# Patient Record
Sex: Male | Born: 1985 | Race: White | Hispanic: No | Marital: Single | Smoking: Current every day smoker
Health system: Southern US, Community
[De-identification: ages and names within clinical notes are randomized; demographics above are authoritative.]

## PROBLEM LIST (undated history)

## (undated) DIAGNOSIS — F32A Depression, unspecified: Secondary | ICD-10-CM

## (undated) DIAGNOSIS — G8929 Other chronic pain: Secondary | ICD-10-CM

## (undated) DIAGNOSIS — F17200 Nicotine dependence, unspecified, uncomplicated: Secondary | ICD-10-CM

## (undated) DIAGNOSIS — Z87898 Personal history of other specified conditions: Secondary | ICD-10-CM

## (undated) DIAGNOSIS — I10 Essential (primary) hypertension: Secondary | ICD-10-CM

## (undated) DIAGNOSIS — F410 Panic disorder [episodic paroxysmal anxiety] without agoraphobia: Secondary | ICD-10-CM

## (undated) HISTORY — DX: Other chronic pain: G89.29

## (undated) HISTORY — DX: Depression, unspecified: F32.A

## (undated) HISTORY — DX: Nicotine dependence, unspecified, uncomplicated: F17.200

## (undated) HISTORY — DX: Personal history of other specified conditions: Z87.898

## (undated) HISTORY — DX: Panic disorder (episodic paroxysmal anxiety): F41.0

## (undated) HISTORY — DX: Essential (primary) hypertension: I10

## (undated) HISTORY — PX: OTHER SURGICAL HISTORY: SHX169

---

## 2007-03-24 ENCOUNTER — Emergency Department (HOSPITAL_COMMUNITY): Admission: EM | Admit: 2007-03-24 | Discharge: 2007-03-24 | Payer: Self-pay | Admitting: Emergency Medicine

## 2007-03-27 ENCOUNTER — Emergency Department (HOSPITAL_COMMUNITY): Admission: EM | Admit: 2007-03-27 | Discharge: 2007-03-27 | Payer: Self-pay | Admitting: Emergency Medicine

## 2007-03-31 ENCOUNTER — Emergency Department (HOSPITAL_COMMUNITY): Admission: EM | Admit: 2007-03-31 | Discharge: 2007-03-31 | Payer: Self-pay | Admitting: Emergency Medicine

## 2007-04-03 ENCOUNTER — Emergency Department (HOSPITAL_COMMUNITY): Admission: EM | Admit: 2007-04-03 | Discharge: 2007-04-03 | Payer: Self-pay | Admitting: Emergency Medicine

## 2007-04-07 ENCOUNTER — Emergency Department (HOSPITAL_COMMUNITY): Admission: EM | Admit: 2007-04-07 | Discharge: 2007-04-07 | Payer: Self-pay | Admitting: Emergency Medicine

## 2007-04-21 ENCOUNTER — Emergency Department (HOSPITAL_COMMUNITY): Admission: EM | Admit: 2007-04-21 | Discharge: 2007-04-21 | Payer: Self-pay | Admitting: Emergency Medicine

## 2008-06-30 IMAGING — CR DG TIBIA/FIBULA 2V*L*
4 series · 4 of 4 positions shown · non-contrast
Comparison: none

CLINICAL DATA: 20-year-old with dog bite to calf. Puncture wounds.
 LEFT TIBIA AND FIBULA - 2 VIEW:

[view not recorded (1 of 4)]
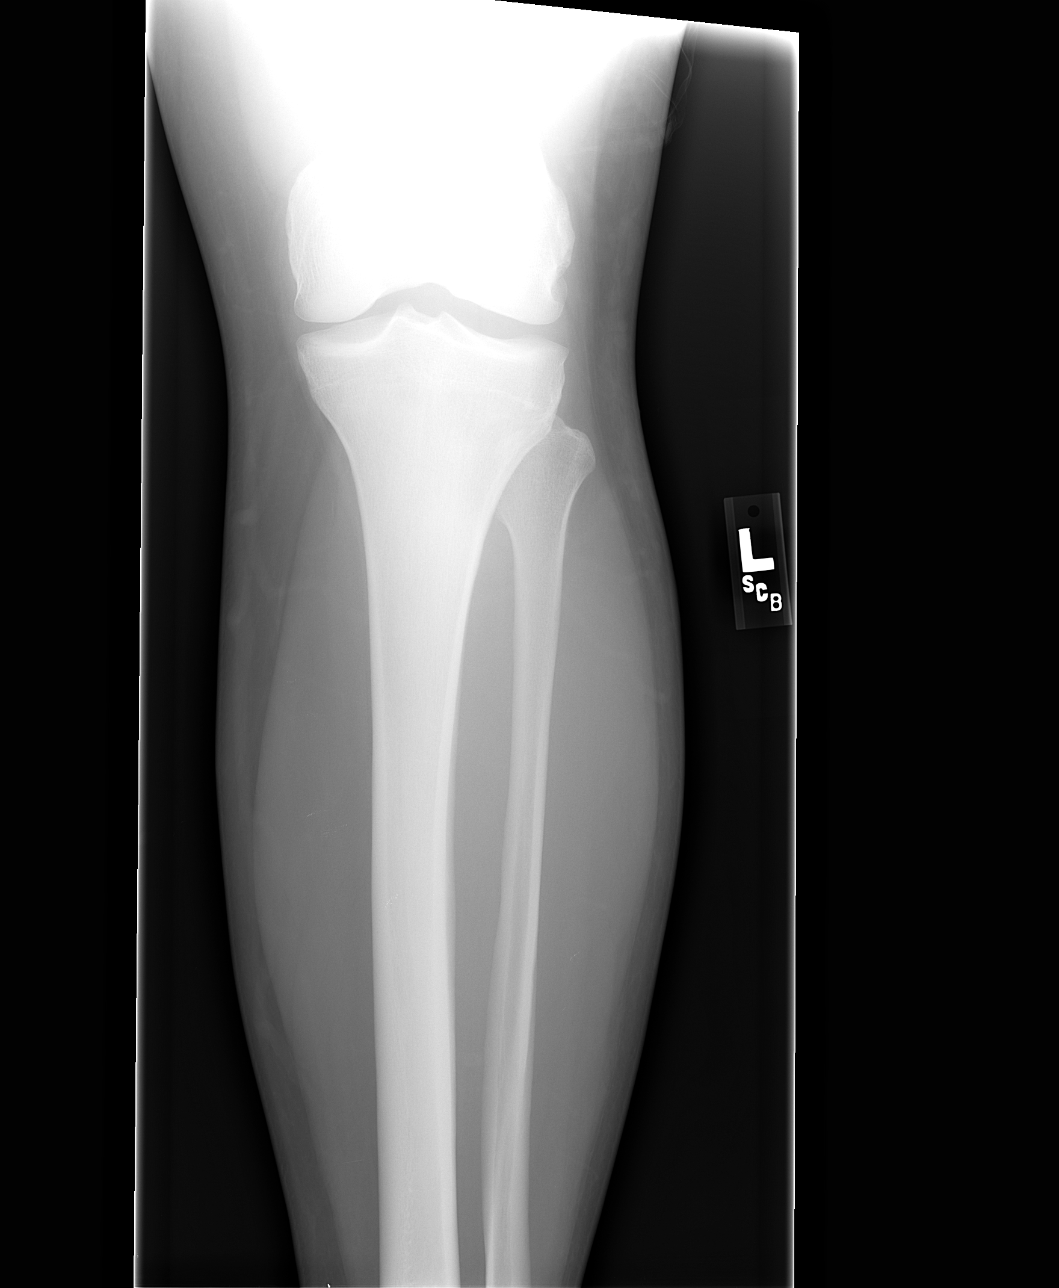

[view not recorded (2 of 4)]
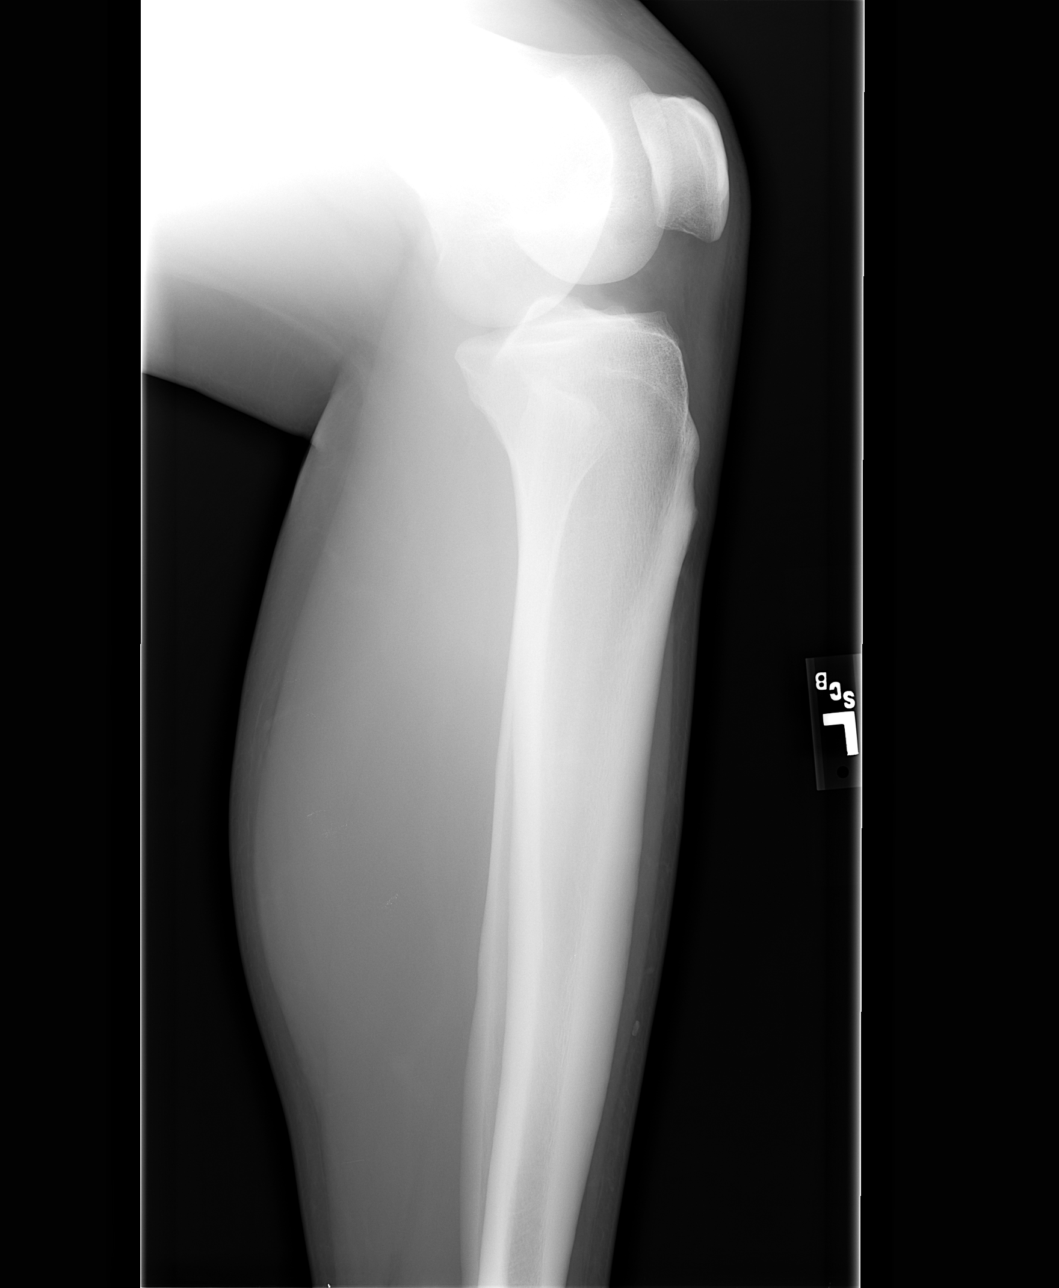

[view not recorded (3 of 4)]
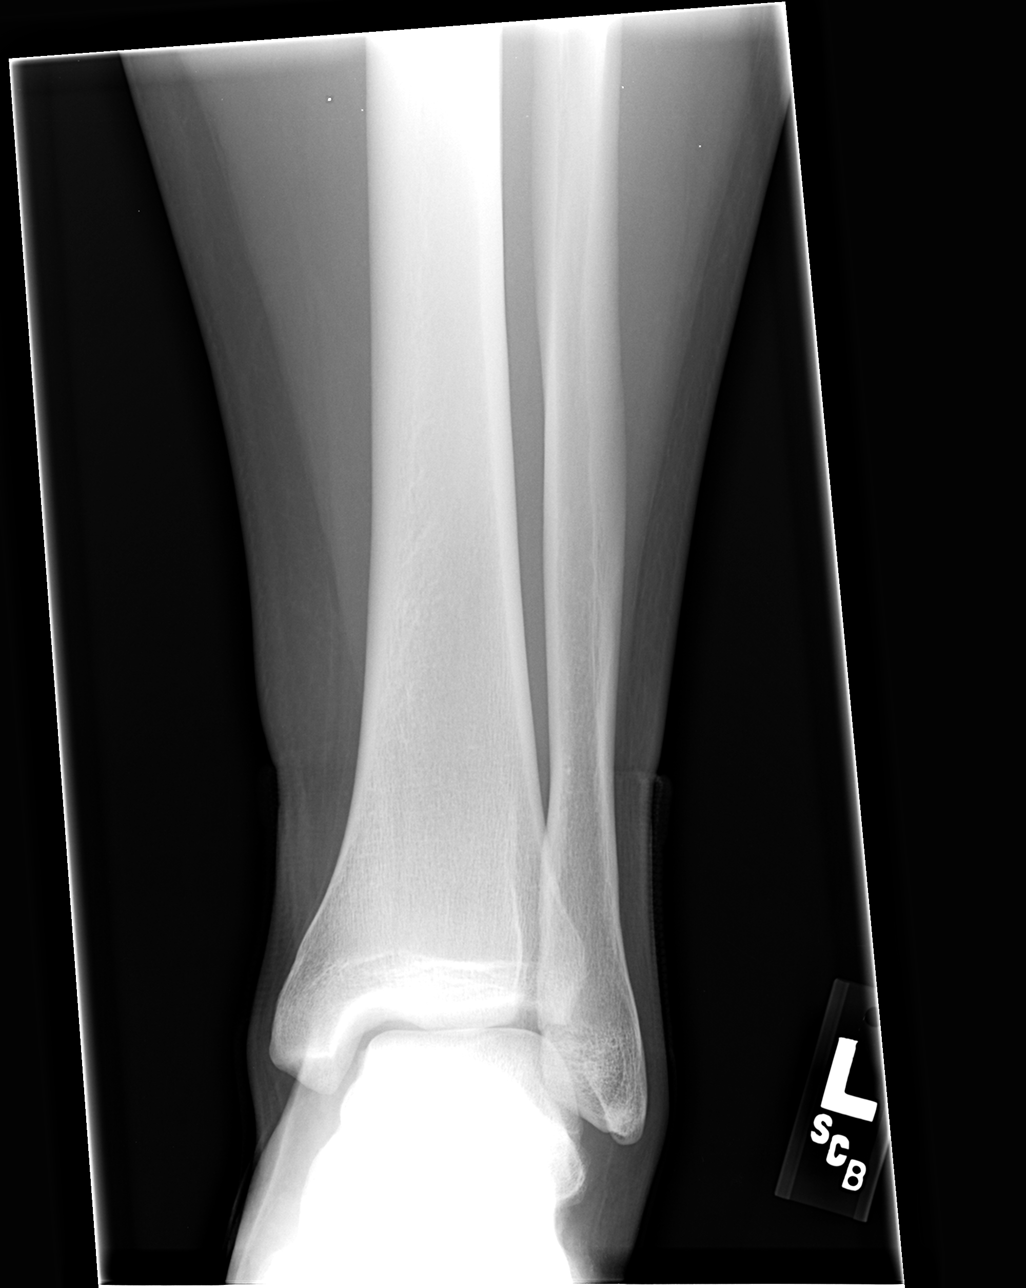

[view not recorded (4 of 4)]
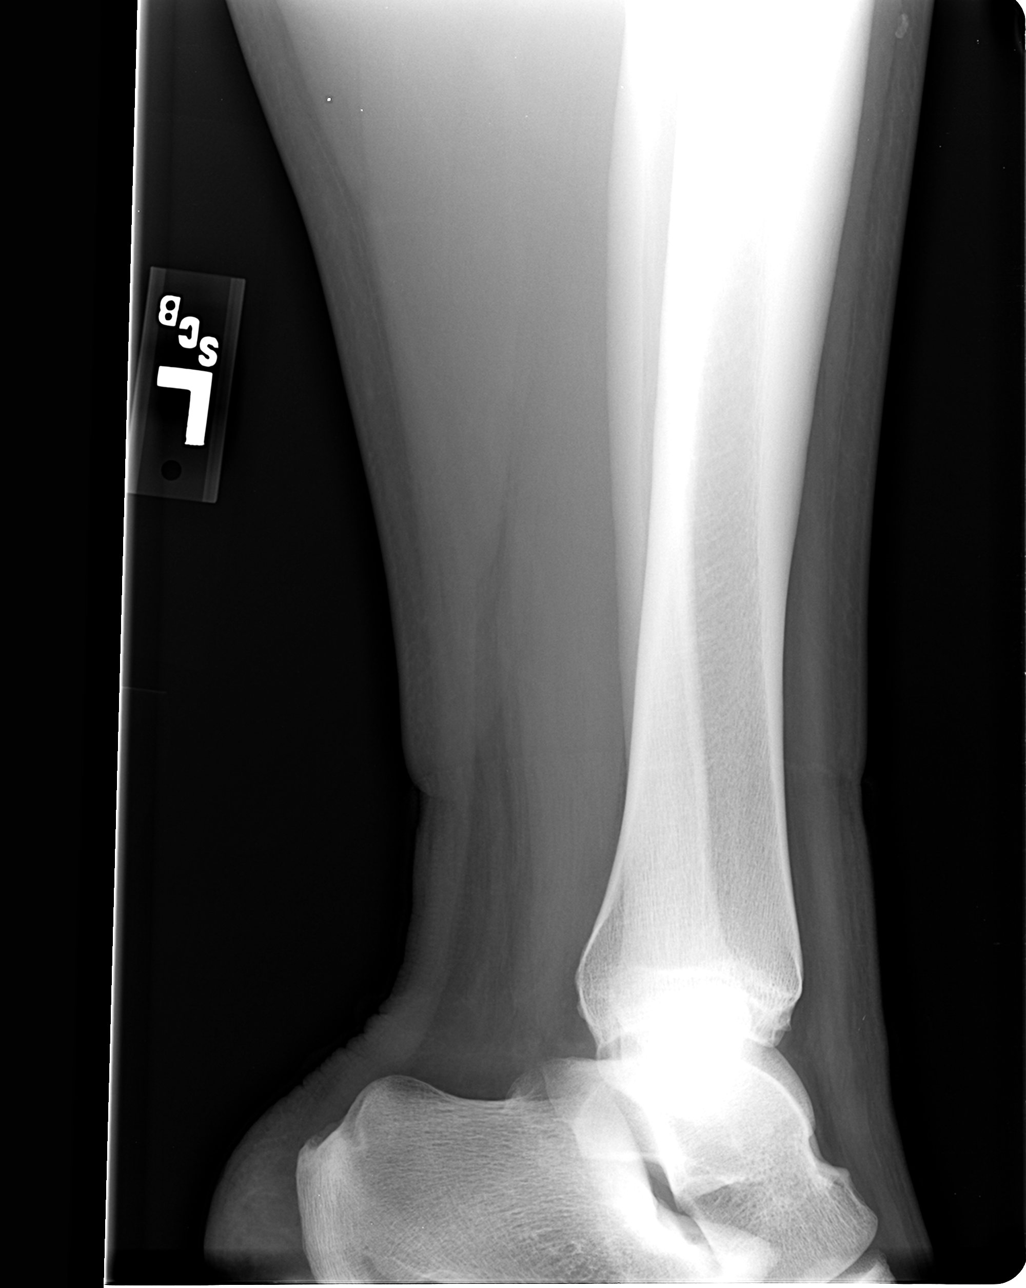

[4 of 4 positions shown; findings below may reference images not displayed]

FINDINGS: There is no evidence of fracture or other focal bone lesions.  Soft tissues are unremarkable.
IMPRESSION: Negative.

## 2009-02-15 ENCOUNTER — Emergency Department (HOSPITAL_COMMUNITY): Admission: EM | Admit: 2009-02-15 | Discharge: 2009-02-15 | Payer: Self-pay | Admitting: Emergency Medicine

## 2009-08-31 ENCOUNTER — Ambulatory Visit: Payer: Self-pay | Admitting: Internal Medicine

## 2009-08-31 ENCOUNTER — Inpatient Hospital Stay (HOSPITAL_COMMUNITY): Admission: EM | Admit: 2009-08-31 | Discharge: 2009-09-01 | Payer: Self-pay | Admitting: Emergency Medicine

## 2009-09-02 ENCOUNTER — Encounter (INDEPENDENT_AMBULATORY_CARE_PROVIDER_SITE_OTHER): Payer: Self-pay | Admitting: *Deleted

## 2010-09-13 NOTE — Letter (Signed)
Summary: Plan of Care, Need to Discuss  [facility]  [address]   [place], [place] [address]   Phone: [phone]  Fax: [phone]    September 02, 2009  MOLLY MASELLI [address] [facility] [address], [place]  [address] 1985/10/20   Dear Mr. Gloor,   We are writing this letter to inform you of treatment plans and/or discuss your plan of care.  We have tried several times to contact you; however, we have yet to reach you.  We ask that you please contact our office for follow-up on your gastrointestinal issues.  We can  be reached at [phone] to schedule an appointment, or to speak with someone regarding your health care needs.  Please do not neglect your health.   Sincerely,    Ave Filter  [facility] Ph: [phone]    Fax: [phone]

## 2010-10-30 LAB — DIFFERENTIAL
Basophils Relative: 0 % (ref 0–1)
Eosinophils Absolute: 0 10*3/uL (ref 0.0–0.7)
Eosinophils Absolute: 0 10*3/uL (ref 0.0–0.7)
Eosinophils Relative: 0 % (ref 0–5)
Lymphocytes Relative: 4 % — ABNORMAL LOW (ref 12–46)
Lymphs Abs: 0.4 10*3/uL — ABNORMAL LOW (ref 0.7–4.0)
Lymphs Abs: 0.6 10*3/uL — ABNORMAL LOW (ref 0.7–4.0)
Monocytes Absolute: 0.2 10*3/uL (ref 0.1–1.0)
Monocytes Absolute: 0.5 10*3/uL (ref 0.1–1.0)
Monocytes Relative: 3 % (ref 3–12)
Monocytes Relative: 4 % (ref 3–12)
Neutro Abs: 10.2 10*3/uL — ABNORMAL HIGH (ref 1.7–7.7)
Neutrophils Relative %: 91 % — ABNORMAL HIGH (ref 43–77)

## 2010-10-30 LAB — PROTIME-INR: INR: 1.07 (ref 0.00–1.49)

## 2010-10-30 LAB — CBC
HCT: 42.5 % (ref 39.0–52.0)
Hemoglobin: 14.8 g/dL (ref 13.0–17.0)
Hemoglobin: 16.4 g/dL (ref 13.0–17.0)
MCHC: 34.5 g/dL (ref 30.0–36.0)
MCV: 86.5 fL (ref 78.0–100.0)
MCV: 86.5 fL (ref 78.0–100.0)
RBC: 5.49 MIL/uL (ref 4.22–5.81)
WBC: 11.2 10*3/uL — ABNORMAL HIGH (ref 4.0–10.5)
WBC: 8.5 10*3/uL (ref 4.0–10.5)

## 2010-10-30 LAB — APTT: aPTT: 26 seconds (ref 24–37)

## 2010-10-30 LAB — HEMOGLOBIN AND HEMATOCRIT, BLOOD
HCT: 38 % — ABNORMAL LOW (ref 39.0–52.0)
HCT: 39.8 % (ref 39.0–52.0)
Hemoglobin: 13.4 g/dL (ref 13.0–17.0)
Hemoglobin: 14.5 g/dL (ref 13.0–17.0)

## 2010-10-30 LAB — BASIC METABOLIC PANEL
BUN: 13 mg/dL (ref 6–23)
CO2: 25 mEq/L (ref 19–32)
Chloride: 105 mEq/L (ref 96–112)
Chloride: 109 mEq/L (ref 96–112)
Creatinine, Ser: 0.91 mg/dL (ref 0.4–1.5)
GFR calc Af Amer: >60 mL/min (ref >60)
GFR calc non Af Amer: >60 mL/min (ref >60)
Potassium: 3.8 mEq/L (ref 3.5–5.1)
Sodium: 138 mEq/L (ref 135–145)
Sodium: 140 mEq/L (ref 135–145)

## 2011-05-29 LAB — DIFFERENTIAL
Basophils Absolute: 0
Eosinophils Absolute: 0
Eosinophils Relative: 0
Lymphocytes Relative: 16
Lymphs Abs: 1.4
Monocytes Absolute: 0.9 — ABNORMAL HIGH

## 2011-05-29 LAB — COMPREHENSIVE METABOLIC PANEL
ALT: 23
AST: 19
Albumin: 3.8
Chloride: 103
Creatinine, Ser: 0.92
GFR calc Af Amer: >60
Sodium: 139
Total Bilirubin: 1.1

## 2011-05-29 LAB — CBC
MCV: 86.9
Platelets: 173
RBC: 5.02
WBC: 8.7

## 2011-05-29 LAB — RAPID STREP SCREEN (MED CTR MEBANE ONLY): Streptococcus, Group A Screen (Direct): NEGATIVE

## 2017-05-14 DIAGNOSIS — F419 Anxiety disorder, unspecified: Secondary | ICD-10-CM | POA: Insufficient documentation

## 2017-05-14 DIAGNOSIS — F410 Panic disorder [episodic paroxysmal anxiety] without agoraphobia: Secondary | ICD-10-CM | POA: Insufficient documentation

## 2022-05-09 ENCOUNTER — Ambulatory Visit (INDEPENDENT_AMBULATORY_CARE_PROVIDER_SITE_OTHER): Payer: 59

## 2022-05-09 ENCOUNTER — Ambulatory Visit: Payer: Self-pay

## 2022-05-09 ENCOUNTER — Ambulatory Visit
Admission: EM | Admit: 2022-05-09 | Discharge: 2022-05-09 | Disposition: A | Discharge status: Home / Self Care | Payer: 59 | Attending: Nurse Practitioner | Admitting: Nurse Practitioner

## 2022-05-09 ENCOUNTER — Encounter: Payer: Self-pay | Admitting: Emergency Medicine

## 2022-05-09 DIAGNOSIS — M7711 Lateral epicondylitis, right elbow: Secondary | ICD-10-CM

## 2022-05-09 DIAGNOSIS — M25521 Pain in right elbow: Secondary | ICD-10-CM | POA: Diagnosis not present

## 2022-05-09 MED ORDER — PREDNISONE 20 MG PO TABS: 40.0000 mg | ORAL_TABLET | Freq: Every day | ORAL | 0 refills | Status: AC

## 2022-05-09 NOTE — ED Triage Notes (Signed)
Right elbow pain x 4 days. Hurts to straighten arm.  States arm feels hot. No known injury.

## 2022-05-09 NOTE — ED Provider Notes (Signed)
[facility]    CSN: [id number] Arrival date & time: 05/09/22  1319      History   Chief Complaint No chief complaint on file.   HPI Jeffrey Hoover is a 36 y.o. male.   The history is provided by the patient.   Presents for complaints of right elbow pain that started approximately 4 days ago.  Patient states over the past 24 hours, pain has progressively worsened.  He complains of pain to the lateral aspect of the elbow that radiates into the right shoulder and right mid forearm.  He states the arm feels "hot on the inside".  He also complains of heaviness of the right upper extremity.  States that he has decreased grip strength and is unable to fully extend the right arm/elbow.  Patient also complains of numbness and tingling.  He denies any new injury or trauma, swelling, or bruising.  He has not taken any medication for his symptoms.  History reviewed. No pertinent past medical history.  There are no problems to display for this patient.   History reviewed. No pertinent surgical history.     Home Medications    Prior to Admission medications   Medication Sig Start Date End Date Taking? Authorizing Provider  predniSONE (DELTASONE) 20 MG tablet Take 2 tablets (40 mg total) by mouth daily with breakfast for 5 days. 05/09/22 05/14/22 Yes Curly Mackowski-Warren, Alda Lea, NP    Family History History reviewed. No pertinent family history.  Social History Social History   Tobacco Use   Smoking status: Former    Types: Cigarettes   Smokeless tobacco: Never  Vaping Use   Vaping Use: Every day   Substances: Nicotine-salt  Substance Use Topics   Alcohol use: Never   Drug use: Never     Allergies   Sulfa antibiotics   Review of Systems Review of Systems Per HPI  Physical Exam Triage Vital Signs ED Triage Vitals  Enc Vitals Group     BP 05/09/22 1334 (!) 130/94     Pulse Rate 05/09/22 1334 84     Resp 05/09/22 1334 16     Temp 05/09/22 1334 98.3 F  (36.8 C)     Temp Source 05/09/22 1334 Oral     SpO2 05/09/22 1334 98 %     Weight --      Height --      Head Circumference --      Peak Flow --      Pain Score 05/09/22 1335 6     Pain Loc --      Pain Edu? --      Excl. in [place]? --    No data found.  Updated Vital Signs BP (!) 130/94 (BP Location: Right Arm)   Pulse 84   Temp 98.3 F (36.8 C) (Oral)   Resp 16   SpO2 98%   Visual Acuity Right Eye Distance:   Left Eye Distance:   Bilateral Distance:    Right Eye Near:   Left Eye Near:    Bilateral Near:     Physical Exam Vitals and nursing note reviewed.  Constitutional:      Appearance: Normal appearance.  HENT:     Head: Normocephalic.  Eyes:     Extraocular Movements: Extraocular movements intact.     Pupils: Pupils are equal, round, and reactive to light.  Cardiovascular:     Pulses: Normal pulses.  Pulmonary:     Effort: Pulmonary effort is normal.  Musculoskeletal:  Right elbow: Decreased range of motion. Tenderness present in lateral epicondyle.     Cervical back: Normal range of motion.  Skin:    General: Skin is warm and dry.  Neurological:     Mental Status: He is alert.  Psychiatric:        Mood and Affect: Mood normal.        Behavior: Behavior normal.      UC Treatments / Results  Labs (all labs ordered are listed, but only abnormal results are displayed) Labs Reviewed - No data to display  EKG   Radiology DG Elbow Complete Right  Result Date: 05/09/2022 CLINICAL DATA:  Elbow pain 4 days no injury EXAM: RIGHT ELBOW - COMPLETE 3+ VIEW COMPARISON:  None Available. FINDINGS: Negative for acute fracture.  No joint effusion Well corticated ossicles adjacent to the olecranon compatible with chronic injury. Elbow joint spaces normal. IMPRESSION: Negative for acute fracture. Well corticated ossicle adjacent to the olecranon likely due to chronic injury. Electronically Signed   By: Marlan Palau M.D.   On: 05/09/2022 13:55     Procedures Procedures (including critical care time)  Medications Ordered in UC Medications - No data to display  Initial Impression / Assessment and Plan / UC Course  I have reviewed the triage vital signs and the nursing notes.  Pertinent labs & imaging results that were available during my care of the patient were reviewed by me and considered in my medical decision making (see chart for details).  Patient presents for complaints of right elbow pain that has been present for the past 4 days.  There is no known injury or trauma noted.  X-rays are negative for fracture or dislocation but do show a "well-corticated ossicle adjacent to the olecranon".  Based on the patient's location of his tenderness, symptoms are consistent with lateral epicondylitis.  It is likely that the corticated ossicle may also be contributing to his symptoms.  Will start patient on prednisone to see if this helps with inflammation.  Also recommend RICE therapy.  Patient was advised to perform gentle range of motion exercises to prevent joint immobility.  Patient advised to follow-up with orthopedics if symptoms fail to improve.  Patient was given information for [facility] and for [facility]. Final Clinical Impressions(s) / UC Diagnoses   Final diagnoses:  Lateral epicondylitis of right elbow  Right elbow pain     Discharge Instructions      The x-rays negative for fracture or dislocation.  The x-ray does show what appears to be a bone spur in the area in which you are having pain.  This likely is contributing to the symptoms you are currently experiencing. Take medication as prescribed.  May take Tylenol Arthritis Strength tablets for breakthrough pain while taking the prednisone.  Once you complete the prednisone, you can take over-the-counter ibuprofen for pain and inflammation.  Take medication as directed and with food. RICE therapy, rest, ice, compression, and elevation. Gentle stretching and  range of motion exercises to help with joint mobility and to decrease recovery time. If symptoms fail to improve after this medication regimen, recommend following up with your primary care physician or orthopedics.  You can follow-up with [facility] at [phone] or [facility] at [phone]. Follow-up as needed.      ED Prescriptions     Medication Sig Dispense Auth. Provider   predniSONE (DELTASONE) 20 MG tablet Take 2 tablets (40 mg total) by mouth daily with breakfast for 5 days.  10 tablet Jurrell Royster-Warren, Sadie Haber, NP      PDMP not reviewed this encounter.   Abran Cantor, NP 05/09/22 1429

## 2022-05-09 NOTE — Discharge Instructions (Addendum)
The x-rays negative for fracture or dislocation.  The x-ray does show what appears to be a bone spur in the area in which you are having pain.  This likely is contributing to the symptoms you are currently experiencing. Take medication as prescribed.  May take Tylenol Arthritis Strength tablets for breakthrough pain while taking the prednisone.  Once you complete the prednisone, you can take over-the-counter ibuprofen for pain and inflammation.  Take medication as directed and with food. RICE therapy, rest, ice, compression, and elevation. Gentle stretching and range of motion exercises to help with joint mobility and to decrease recovery time. If symptoms fail to improve after this medication regimen, recommend following up with your primary care physician or orthopedics.  You can follow-up with [facility] [place] at [phone] or [facility] at [phone]. Follow-up as needed.

## 2022-05-23 ENCOUNTER — Telehealth: Payer: Self-pay | Admitting: Orthopedic Surgery

## 2022-05-23 NOTE — Telephone Encounter (Signed)
Spoke with patient who has called today, 05/23/22, following [facility] visit 05/09/25, for arm/bicep injury. We have scheduled appointment for next available, schedule Monday, 05/29/22. Patient then relayed he heard a pop in his arm/elbow area. Please advise

## 2022-05-23 NOTE — Telephone Encounter (Signed)
Called back to patient to relay. °

## 2022-05-29 ENCOUNTER — Encounter: Payer: Self-pay | Admitting: Orthopedic Surgery

## 2022-05-29 ENCOUNTER — Ambulatory Visit: Payer: 59 | Admitting: Orthopedic Surgery

## 2022-05-29 VITALS — BP 140/101 | HR 85 | Ht 72.0 in | Wt 277.0 lb

## 2022-05-29 DIAGNOSIS — M7711 Lateral epicondylitis, right elbow: Secondary | ICD-10-CM | POA: Diagnosis not present

## 2022-05-29 MED ORDER — INDOMETHACIN 25 MG PO CAPS: 25.0000 mg | ORAL_CAPSULE | Freq: Three times a day (TID) | ORAL | 1 refills | Status: DC

## 2022-05-29 MED ORDER — METHYLPREDNISOLONE ACETATE 40 MG/ML IJ SUSP
40.0000 mg | Freq: Once | INTRAMUSCULAR | Status: AC
  Administered 2022-05-29: 40 mg via INTRA_ARTICULAR

## 2022-05-29 NOTE — Patient Instructions (Signed)
ICE THE ELBOW FOR 30 MIN IN THE EVENING  WEAR THE BRACE DURING DAYTIME  TAKE THE MEDICATION WITH FOOD

## 2022-05-29 NOTE — Progress Notes (Signed)
Chief Complaint  Patient presents with   Elbow Pain    RT elbow painful and swelling Painful since 04/26/22 NKI    HPI: Jeffrey Hoover was 36 years old he used to work in the [employer] now he is a Freight forwarder for a Architect site.  He presents with pain in his right elbow since September 13 with no history of trauma.  He says he has lateral elbow pain it radiates proximally and distally has trouble gripping he has decreased range of motion in the extension of his elbow and he has nocturnal pain  He was seen in urgent care x-rays were obtained he has an olecranon spur.  He does do some light computer use but not too much we have not identified a provoking mechanism for his lateral elbow pain  Review of systems he does complain of bilateral heel pain  He complains of ringing in his ears night sweating some fatigue pain in his legs after walking related to his heel pain  No past medical history on file.  BP (!) 140/101   Pulse 85   Ht 6' (1.829 m)   Wt 277 lb (125.6 kg)   BMI 37.57 kg/m    General appearance: Well-developed well-nourished no gross deformities  Cardiovascular normal pulse and perfusion normal color without edema  Neurologically no sensation loss or deficits or pathologic reflexes  Psychological: Awake alert and oriented x3 mood and affect normal  Skin no lacerations or ulcerations no nodularity no palpable masses, no erythema or nodularity  Musculoskeletal: Decreased range of motion in terms of extension of the elbow about 15 degrees flexion is normal he has pain with pronation supination as well as extension he has tenderness over the lateral epicondyle has provocative test positive for lateral epicondylitis elbow is stable  Imaging interpreted his outside imaging as olecranon spur no evidence of fracture normal joints around the elbow  A/P  Encounter Diagnosis  Name Primary?   Lateral epicondylitis, right elbow Yes    Recommend he have ice therapy, NSAID  therapy Injection Bracing Home exercises Education 6 week follow-up  Procedure note injection for right tennis elbow   Diagnosis right tennis elbow  Anesthesia ethyl chloride was used Alcohol use is clean the skin  After we obtained verbal consent and timeout a 25-gauge needle was used to inject 40 mg of Depo-Medrol and 3 cc of 1% lidocaine just distal to the insertion of the ECRB  There were no complications and a sterile bandage was applied.

## 2022-06-12 ENCOUNTER — Ambulatory Visit (INDEPENDENT_AMBULATORY_CARE_PROVIDER_SITE_OTHER): Payer: 59

## 2022-06-12 ENCOUNTER — Ambulatory Visit: Payer: 59 | Admitting: Orthopedic Surgery

## 2022-06-12 ENCOUNTER — Encounter: Payer: Self-pay | Admitting: Orthopedic Surgery

## 2022-06-12 VITALS — BP 168/101 | HR 96 | Ht 73.0 in | Wt 279.0 lb

## 2022-06-12 DIAGNOSIS — G8929 Other chronic pain: Secondary | ICD-10-CM

## 2022-06-12 DIAGNOSIS — M25571 Pain in right ankle and joints of right foot: Secondary | ICD-10-CM

## 2022-06-12 DIAGNOSIS — M25572 Pain in left ankle and joints of left foot: Secondary | ICD-10-CM

## 2022-06-12 NOTE — Progress Notes (Unsigned)
Chief Complaint  Patient presents with   Ankle Pain    Bilateral/ RT>LT Has been painful for about 6 years Painful around back of the heel Feels like he injured RT ankle a couple of months when stepping off of a piece of steel    HPI: 36 yo male bilateral heel pain for 6 years   Minor trauma right side a few weeks ago, but no surgeries   Pain and tightness are worse after sitting and restarting.  Pain is located posterior and medially associated with swelling and some discoloration around the skin.  Currently taking indomethacin for his lateral epicondylitis on the right recently seen for this in this office.  No past medical history on file.  No past medical history on file. No past surgical history on file. Current Outpatient Medications  Medication Instructions   indomethacin (INDOCIN) 25 mg, Oral, 3 times daily with meals     BP (!) 168/101   Pulse 96   Ht 6\' 1"  (1.854 m)   Wt 279 lb (126.6 kg)   BMI 36.81 kg/m    General appearance: Well-developed well-nourished no gross deformities  Cardiovascular normal pulse and perfusion normal color without edema  Neurologically no sensation loss or deficits or pathologic reflexes  Psychological: Awake alert and oriented x3 mood and affect normal  Skin no lacerations or ulcerations no nodularity no palpable masses, no erythema or nodularity  Musculoskeletal:  Bilateral ankle exam emanation patient has bilateral swelling around the ankle and tenderness at the Achilles insertion with prominence of the superior angle of the calcaneus  The areas of discoloration seem to be varicose veins  Normal arches  No weakness    Imaging both ankles were imaged  A/P  Chronic insertional tendinitis.  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

## 2022-07-10 ENCOUNTER — Ambulatory Visit: Payer: 59 | Admitting: Orthopedic Surgery

## 2022-07-10 ENCOUNTER — Encounter: Payer: Self-pay | Admitting: Orthopedic Surgery

## 2022-07-10 DIAGNOSIS — F419 Anxiety disorder, unspecified: Secondary | ICD-10-CM | POA: Diagnosis not present

## 2022-07-10 DIAGNOSIS — M7711 Lateral epicondylitis, right elbow: Secondary | ICD-10-CM | POA: Diagnosis not present

## 2022-07-10 DIAGNOSIS — F332 Major depressive disorder, recurrent severe without psychotic features: Secondary | ICD-10-CM | POA: Diagnosis not present

## 2022-07-10 MED ORDER — PREDNISONE 10 MG PO TABS: 10.0000 mg | ORAL_TABLET | Freq: Three times a day (TID) | ORAL | 0 refills | Status: DC

## 2022-07-10 NOTE — Patient Instructions (Addendum)
Per your request, we have placed a referral to [facility]. I chose the [place] office for you.   Phone: [phone]

## 2022-07-10 NOTE — Progress Notes (Signed)
Chief Complaint  Patient presents with   Elbow Pain    Right- has good and bad days even on the good days doesn't have any strength to lift anything  Request a referral to be placed to a psychiatrist for depression and anxiety things spiraling out of control due to performance issues      Still having pain in that right elbow.  No real improvement after injection indomethacin exercises and ice  Exam shows tenderness over the lateral epicondyle with painful wrist extension consistent with tennis elbow right upper extremity  More importantly patient is having issues with his depression and anxiety  We will make a referral to the psychiatrist  Recommend wrist extension block splint  Switch to Medrol Dosepak Meds ordered this encounter  Medications   predniSONE (DELTASONE) 10 MG tablet    Sig: Take 1 tablet (10 mg total) by mouth 3 (three) times daily.    Dispense:  42 tablet    Refill:  0   Re ch 6 weeks

## 2022-08-21 ENCOUNTER — Ambulatory Visit: Payer: 59 | Admitting: Orthopedic Surgery

## 2022-08-21 DIAGNOSIS — M7711 Lateral epicondylitis, right elbow: Secondary | ICD-10-CM | POA: Diagnosis not present

## 2022-08-21 NOTE — Progress Notes (Signed)
Chief Complaint  Patient presents with   Follow-up    Recheck on right elbow.

## 2022-08-21 NOTE — Progress Notes (Signed)
Chief Complaint  Patient presents with   Follow-up    Recheck on right elbow.   Encounter Diagnosis  Name Primary?   Lateral epicondylitis, right elbow Yes   37 year old male with lateral epicondylitis currently on indomethacin.  He finished half of his prednisone Dosepak and then lost  He says the pain is improved where he does not notice it as much.  He does avoid lifting heavy objects  He is tender over the lateral epicondyle he has some pain with wrist extension against resistance  He also has a possible Dupuytren's versus flexor tenosynovitis of the left hand small and ring finger  Currently recommend brace, indomethacin, exercises follow-up in 2 months

## 2022-08-21 NOTE — Patient Instructions (Signed)
Brace  Indomethacin  Exercises  Ice at night

## 2022-09-08 ENCOUNTER — Ambulatory Visit (INDEPENDENT_AMBULATORY_CARE_PROVIDER_SITE_OTHER): Payer: 59 | Admitting: Psychiatry

## 2022-09-08 ENCOUNTER — Encounter (HOSPITAL_COMMUNITY): Payer: Self-pay | Admitting: Psychiatry

## 2022-09-08 DIAGNOSIS — F17290 Nicotine dependence, other tobacco product, uncomplicated: Secondary | ICD-10-CM

## 2022-09-08 DIAGNOSIS — I10 Essential (primary) hypertension: Secondary | ICD-10-CM | POA: Insufficient documentation

## 2022-09-08 DIAGNOSIS — F1221 Cannabis dependence, in remission: Secondary | ICD-10-CM

## 2022-09-08 DIAGNOSIS — F4310 Post-traumatic stress disorder, unspecified: Secondary | ICD-10-CM | POA: Diagnosis not present

## 2022-09-08 DIAGNOSIS — F410 Panic disorder [episodic paroxysmal anxiety] without agoraphobia: Secondary | ICD-10-CM

## 2022-09-08 DIAGNOSIS — Z758 Other problems related to medical facilities and other health care: Secondary | ICD-10-CM

## 2022-09-08 DIAGNOSIS — F332 Major depressive disorder, recurrent severe without psychotic features: Secondary | ICD-10-CM

## 2022-09-08 DIAGNOSIS — R45851 Suicidal ideations: Secondary | ICD-10-CM

## 2022-09-08 DIAGNOSIS — G8929 Other chronic pain: Secondary | ICD-10-CM | POA: Diagnosis not present

## 2022-09-08 DIAGNOSIS — F411 Generalized anxiety disorder: Secondary | ICD-10-CM

## 2022-09-08 DIAGNOSIS — F1691 Hallucinogen use, unspecified, in remission: Secondary | ICD-10-CM | POA: Insufficient documentation

## 2022-09-08 DIAGNOSIS — Z87898 Personal history of other specified conditions: Secondary | ICD-10-CM

## 2022-09-08 HISTORY — DX: Other problems related to medical facilities and other health care: Z75.8

## 2022-09-08 HISTORY — DX: Cannabis dependence, in remission: F12.21

## 2022-09-08 HISTORY — DX: Suicidal ideations: R45.851
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
## 2022-09-08 NOTE — Patient Instructions (Signed)
We did not start a medication today as we need to check to see how your organs are functioning before we can safely prescribe anything.  I did place a referral for psychotherapy and you should be able to get that scheduled.  If you can get established with a primary care provider who can then manage the results of the blood tests that would get drawn.  I have ordered blood tests if you are able to get a fasting set of labs that would be the most accurate ones.  You should be able to come to the blood draw station at the clinic on [address].

## 2022-09-19 ENCOUNTER — Encounter (HOSPITAL_COMMUNITY): Payer: Self-pay | Admitting: Psychiatry

## 2022-09-19 ENCOUNTER — Other Ambulatory Visit (HOSPITAL_COMMUNITY): Payer: Self-pay

## 2022-09-19 DIAGNOSIS — E559 Vitamin D deficiency, unspecified: Secondary | ICD-10-CM

## 2022-09-20 LAB — COMPLETE METABOLIC PANEL WITH GFR
AG Ratio: 1.8 (calc) (ref 1.0–2.5)
ALT: 28 U/L (ref 9–46)
AST: 18 U/L (ref 10–40)
Albumin: 4.8 g/dL (ref 3.6–5.1)
Alkaline phosphatase (APISO): 96 U/L (ref 36–130)
BUN: 14 mg/dL (ref 7–25)
CO2: 26 mmol/L (ref 20–32)
Calcium: 9.9 mg/dL (ref 8.6–10.3)
Chloride: 107 mmol/L (ref 98–110)
Creat: 0.79 mg/dL (ref 0.60–1.26)
Globulin: 2.6 g/dL (calc) (ref 1.9–3.7)
Glucose, Bld: 108 mg/dL — ABNORMAL HIGH (ref 65–99)
Potassium: 4.9 mmol/L (ref 3.5–5.3)
Sodium: 142 mmol/L (ref 135–146)
Total Bilirubin: 0.5 mg/dL (ref 0.2–1.2)
Total Protein: 7.4 g/dL (ref 6.1–8.1)
eGFR: 119 mL/min/{1.73_m2} (ref >=60)

## 2022-09-20 LAB — CBC
HCT: 43.8 % (ref 38.5–50.0)
Hemoglobin: 15.4 g/dL (ref 13.2–17.1)
MCH: 30.1 pg (ref 27.0–33.0)
MCHC: 35.2 g/dL (ref 32.0–36.0)
MCV: 85.7 fL (ref 80.0–100.0)
MPV: 9.9 fL (ref 7.5–12.5)
Platelets: 193 10*3/uL (ref 140–400)
RBC: 5.11 10*6/uL (ref 4.20–5.80)
RDW: 12.8 % (ref 11.0–15.0)
WBC: 4.4 10*3/uL (ref 3.8–10.8)

## 2022-09-20 LAB — LIPID PANEL
Cholesterol: 156 mg/dL (ref <200)
HDL: 49 mg/dL (ref >=40)
LDL Cholesterol (Calc): 89 mg/dL (calc)
Non-HDL Cholesterol (Calc): 107 mg/dL (calc) (ref <130)
Total CHOL/HDL Ratio: 3.2 (calc) (ref <5.0)
Triglycerides: 86 mg/dL (ref <150)

## 2022-09-20 LAB — HEMOGLOBIN A1C
Hgb A1c MFr Bld: 5.3 % of total Hgb (ref <5.7)
Mean Plasma Glucose: 105 mg/dL
eAG (mmol/L): 5.8 mmol/L

## 2022-09-20 LAB — TSH: TSH: 0.67 mIU/L (ref 0.40–4.50)

## 2022-09-20 LAB — VITAMIN B12: Vitamin B-12: 340 pg/mL (ref 200–1100)

## 2022-09-20 LAB — VITAMIN D 25 HYDROXY (VIT D DEFICIENCY, FRACTURES): Vit D, 25-Hydroxy: 24 ng/mL — ABNORMAL LOW (ref 30–100)

## 2022-09-25 ENCOUNTER — Encounter (HOSPITAL_COMMUNITY): Payer: Self-pay

## 2022-09-25 ENCOUNTER — Telehealth (HOSPITAL_COMMUNITY): Payer: 59 | Admitting: Psychiatry

## 2022-09-27 ENCOUNTER — Encounter (HOSPITAL_COMMUNITY): Payer: Self-pay

## 2022-09-27 ENCOUNTER — Encounter (HOSPITAL_COMMUNITY): Payer: Self-pay | Admitting: Psychiatry

## 2022-09-27 ENCOUNTER — Telehealth (INDEPENDENT_AMBULATORY_CARE_PROVIDER_SITE_OTHER): Payer: 59 | Admitting: Psychiatry

## 2022-09-27 DIAGNOSIS — R45851 Suicidal ideations: Secondary | ICD-10-CM | POA: Diagnosis not present

## 2022-09-27 DIAGNOSIS — F411 Generalized anxiety disorder: Secondary | ICD-10-CM

## 2022-09-27 DIAGNOSIS — F332 Major depressive disorder, recurrent severe without psychotic features: Secondary | ICD-10-CM

## 2022-09-27 DIAGNOSIS — Z758 Other problems related to medical facilities and other health care: Secondary | ICD-10-CM

## 2022-09-27 DIAGNOSIS — F4310 Post-traumatic stress disorder, unspecified: Secondary | ICD-10-CM | POA: Diagnosis not present

## 2022-09-27 DIAGNOSIS — I10 Essential (primary) hypertension: Secondary | ICD-10-CM

## 2022-09-27 DIAGNOSIS — F17290 Nicotine dependence, other tobacco product, uncomplicated: Secondary | ICD-10-CM

## 2022-09-27 DIAGNOSIS — F410 Panic disorder [episodic paroxysmal anxiety] without agoraphobia: Secondary | ICD-10-CM

## 2022-09-27 MED ORDER — DULOXETINE HCL 20 MG PO CPEP: 20.0000 mg | ORAL_CAPSULE | Freq: Every day | ORAL | 1 refills | Status: DC

## 2022-09-27 NOTE — Patient Instructions (Signed)
We added Cymbalta 20 mg once daily to your regimen today.  This will take several weeks to kick in but he should start to notice some improvement over the next month and we will meet again at that time.  Do try to establish with a primary care doctor as getting an EKG would still be helpful and having someone monitor your blood pressure more directly.  Starting with the 400 unit vitamin D supplement be a good start but I will defer to her primary care doctor to fully manage your deficiency.
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
## 2022-10-12 ENCOUNTER — Encounter: Payer: Self-pay | Admitting: Radiology

## 2022-10-23 ENCOUNTER — Ambulatory Visit: Payer: 59 | Admitting: Orthopedic Surgery

## 2022-10-26 ENCOUNTER — Encounter (HOSPITAL_COMMUNITY): Payer: Self-pay | Admitting: Psychiatry

## 2022-10-26 ENCOUNTER — Telehealth (INDEPENDENT_AMBULATORY_CARE_PROVIDER_SITE_OTHER): Payer: 59 | Admitting: Psychiatry

## 2022-10-26 DIAGNOSIS — Z758 Other problems related to medical facilities and other health care: Secondary | ICD-10-CM

## 2022-10-26 DIAGNOSIS — F332 Major depressive disorder, recurrent severe without psychotic features: Secondary | ICD-10-CM | POA: Diagnosis not present

## 2022-10-26 DIAGNOSIS — F410 Panic disorder [episodic paroxysmal anxiety] without agoraphobia: Secondary | ICD-10-CM

## 2022-10-26 DIAGNOSIS — F411 Generalized anxiety disorder: Secondary | ICD-10-CM | POA: Diagnosis not present

## 2022-10-26 DIAGNOSIS — F4310 Post-traumatic stress disorder, unspecified: Secondary | ICD-10-CM

## 2022-10-26 DIAGNOSIS — F17290 Nicotine dependence, other tobacco product, uncomplicated: Secondary | ICD-10-CM | POA: Diagnosis not present

## 2022-10-26 DIAGNOSIS — G8929 Other chronic pain: Secondary | ICD-10-CM

## 2022-10-26 DIAGNOSIS — I10 Essential (primary) hypertension: Secondary | ICD-10-CM

## 2022-10-26 MED ORDER — DULOXETINE HCL 20 MG PO CPEP: 20.0000 mg | ORAL_CAPSULE | Freq: Every day | ORAL | 1 refills | Status: DC
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
## 2022-10-26 NOTE — Patient Instructions (Signed)
We didn't make any medication changes today. As soon as you can, establish with a PCP to manage your blood pressure and vitamin d deficiency.

## 2022-11-01 ENCOUNTER — Encounter (HOSPITAL_COMMUNITY): Payer: Self-pay

## 2022-11-01 ENCOUNTER — Ambulatory Visit (INDEPENDENT_AMBULATORY_CARE_PROVIDER_SITE_OTHER): Payer: 59 | Admitting: Clinical

## 2022-11-01 DIAGNOSIS — F411 Generalized anxiety disorder: Secondary | ICD-10-CM

## 2022-11-01 DIAGNOSIS — F331 Major depressive disorder, recurrent, moderate: Secondary | ICD-10-CM | POA: Diagnosis not present

## 2022-11-01 DIAGNOSIS — F410 Panic disorder [episodic paroxysmal anxiety] without agoraphobia: Secondary | ICD-10-CM | POA: Diagnosis not present

## 2022-11-01 DIAGNOSIS — F4310 Post-traumatic stress disorder, unspecified: Secondary | ICD-10-CM
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
## 2022-11-13 ENCOUNTER — Encounter: Payer: Self-pay | Admitting: Orthopedic Surgery

## 2022-11-13 ENCOUNTER — Ambulatory Visit: Payer: BC Managed Care – PPO | Admitting: Orthopedic Surgery

## 2022-11-13 DIAGNOSIS — M7711 Lateral epicondylitis, right elbow: Secondary | ICD-10-CM

## 2022-11-13 NOTE — Progress Notes (Signed)
Follow-up visit  Chief Complaint  Patient presents with   Elbow Pain    Right /feels better    Encounter Diagnosis  Name Primary?   Lateral epicondylitis, right elbow Yes    Jeffrey Hoover was treated with anti-inflammatories and exercises as well as bracing and injection and has improved to the point where he only has residual mild symptoms on his right elbow.  He says he can grip and grab now without difficulty  His exam shows full range of motion no tenderness over the elbow mild pain with wrist extension resistance testing  Resolved tennis elbow return if any problems

## 2022-11-28 ENCOUNTER — Encounter (HOSPITAL_COMMUNITY): Payer: Self-pay | Admitting: Psychiatry

## 2022-11-28 ENCOUNTER — Telehealth (INDEPENDENT_AMBULATORY_CARE_PROVIDER_SITE_OTHER): Payer: BC Managed Care – PPO | Admitting: Psychiatry

## 2022-11-28 ENCOUNTER — Encounter (HOSPITAL_COMMUNITY): Payer: Self-pay

## 2022-11-28 DIAGNOSIS — F332 Major depressive disorder, recurrent severe without psychotic features: Secondary | ICD-10-CM | POA: Diagnosis not present

## 2022-11-28 DIAGNOSIS — F17290 Nicotine dependence, other tobacco product, uncomplicated: Secondary | ICD-10-CM | POA: Diagnosis not present

## 2022-11-28 DIAGNOSIS — F410 Panic disorder [episodic paroxysmal anxiety] without agoraphobia: Secondary | ICD-10-CM

## 2022-11-28 DIAGNOSIS — G8929 Other chronic pain: Secondary | ICD-10-CM

## 2022-11-28 DIAGNOSIS — F4310 Post-traumatic stress disorder, unspecified: Secondary | ICD-10-CM

## 2022-11-28 DIAGNOSIS — F411 Generalized anxiety disorder: Secondary | ICD-10-CM

## 2022-11-28 MED ORDER — DULOXETINE HCL 40 MG PO CPEP: 40.0000 mg | ORAL_CAPSULE | Freq: Every day | ORAL | 1 refills | Status: DC

## 2022-11-28 NOTE — Patient Instructions (Signed)
We increased the Cymbalta to 40 mg once daily today.  This should help with some of the motivation issues as well as lessening further the amount of paranoia and anxiety that you are having.  Do try to establish with a primary care provider for your uncontrolled hypertension and they should be able to help with referral for leg.
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
## 2022-11-30 NOTE — Telephone Encounter (Signed)
Spoke with patient and PA was completed

## 2022-12-04 ENCOUNTER — Telehealth (HOSPITAL_COMMUNITY): Payer: Self-pay

## 2022-12-04 NOTE — Telephone Encounter (Signed)
Medication managment - Telephone message left for patient's [facility] that his Duloxetine 40 mg capsules were approved by his [employer] of . Requested they call back if any issues with filling medications as ordered. Medication approved from 11/29/22-11/29/23 and reference # [id number].

## 2022-12-04 NOTE — Telephone Encounter (Signed)
Not yet. Still waiting for the insurance company

## 2022-12-05 NOTE — Telephone Encounter (Signed)
Called medical records to follow up on records request from April 18th. Staff was not able to reach anyone and [facility] for them to call office back with an update on records being sent out. Office number was left on voicemail.

## 2022-12-13 ENCOUNTER — Ambulatory Visit (HOSPITAL_COMMUNITY): Payer: BC Managed Care – PPO | Admitting: Clinical

## 2022-12-13 DIAGNOSIS — F4310 Post-traumatic stress disorder, unspecified: Secondary | ICD-10-CM | POA: Diagnosis not present

## 2022-12-13 DIAGNOSIS — F332 Major depressive disorder, recurrent severe without psychotic features: Secondary | ICD-10-CM

## 2022-12-13 DIAGNOSIS — F410 Panic disorder [episodic paroxysmal anxiety] without agoraphobia: Secondary | ICD-10-CM

## 2022-12-13 DIAGNOSIS — F411 Generalized anxiety disorder: Secondary | ICD-10-CM

## 2022-12-13 NOTE — Progress Notes (Signed)
IN PERSON  I connected with Jeffrey Hoover on 12/13/22 at 11:00 AM EDT in person and verified that I am speaking with the correct person using two identifiers.  Location: Patient: Office Provider: Office   I discussed the limitations of evaluation and management by telemedicine and the availability of in person appointments. The patient expressed understanding and agreed to proceed. ( IN PERSON)   THERAPIST PROGRESS NOTE   Session Time: 11:00 AM-11:45 AM   Participation Level: Active   Behavioral Response: CasualAlertAnxious   Type of Therapy: Individual Therapy   Treatment Goals addressed: Coping for Anxiety/PTSD/Depression   Interventions: CBT, Motivational Interviewing, Solution Focused and Supportive   Summary: Jeffrey Hoover is a 37 y.o. male who presents with MDD/GAD/PTSD . The OPT therapist worked with the patient for his scheduled session. The OPT therapist utilized Motivational Interviewing to assist in creating therapeutic repore. The patient spoke about his difficulty recently with changing his medical insurance and this transition leaving him without coverage to be able to get his medication that was prescribed from the pharmacy for a week which led him into severe difficulty with symptom management. The patient since over the past week was able to get the insurance cover, get his medication filled, and get back on his medication which has started after 5 days to level his symptoms back out. The patient spoke about working in the home doing in home projects. The OPT therapist overviewed patient basic need areas including sleep cycle, eating habits, hygiene, and physical exercise. The OPT therapist overviewed coping strategies to further assist the patient in management of his MH symptoms. The OPT therapist over-viewed with the patient upcoming appointments as listed in his MyChart.    Suicidal/Homicidal: Nowithout intent/plan   Therapist Response: The OPT therapist  worked with the patient for the patients scheduled session. The patient was engaged in his session and gave feedback in relation to triggers, symptoms, and behavior responses over the past few weeks. The OPT therapist worked with the patient utilizing an in session Cognitive Behavioral Therapy exercise. The patient was responsive in the session and verbalized," I went a week without my medicine due to a change with my insurance and for that week I really struggled and I went off on everybody and it was bad and I have been back on my medicine for about 5 days now and things are starting to level back out," The OPT therapist worked with the patient overviewing his support symptoms and  implementation of coping strategies including leveraging seasonal coping strategies.  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 The OPT therapist worked with the patient overviewing appointments listed in the patients MyChart.  The OPT therapist will continue treatment work with the patient in his next scheduled session.     Plan: Return again in 2/3 weeks.   Diagnosis:      Axis I: GAD/PTSD/MDD                           Axis II: No diagnosis     Collaboration of Care: Overview of patient involvement in the med therapy program with Dr. Adrian Blackwater.   Patient/Guardian was advised Release of Information must be obtained prior to any record release in order to collaborate their care with an outside provider. Patient/Guardian  was advised if they have not already done so to contact the registration department to sign all necessary forms in order for [place] to release information regarding their care.    Consent: Patient/Guardian gives verbal consent for treatment and assignment of benefits for services provided during this visit. Patient/Guardian expressed  understanding and agreed to proceed.    I discussed the assessment and treatment plan with the patient. The patient was provided an opportunity to ask questions and all were answered. The patient agreed with the plan and demonstrated an understanding of the instructions.   The patient was advised to call back or seek an in-person evaluation if the symptoms worsen or if the condition fails to improve as anticipated.   I provided 45 minutes of face-to-face time during this encounter.   Winfred Burn, LCSW    12/13/2022

## 2022-12-27 DIAGNOSIS — R600 Localized edema: Secondary | ICD-10-CM | POA: Diagnosis not present

## 2022-12-27 DIAGNOSIS — R252 Cramp and spasm: Secondary | ICD-10-CM | POA: Diagnosis not present

## 2022-12-27 DIAGNOSIS — M7989 Other specified soft tissue disorders: Secondary | ICD-10-CM | POA: Diagnosis not present

## 2022-12-27 DIAGNOSIS — I83893 Varicose veins of bilateral lower extremities with other complications: Secondary | ICD-10-CM | POA: Diagnosis not present

## 2022-12-27 DIAGNOSIS — I8311 Varicose veins of right lower extremity with inflammation: Secondary | ICD-10-CM | POA: Diagnosis not present

## 2022-12-27 DIAGNOSIS — I87393 Chronic venous hypertension (idiopathic) with other complications of bilateral lower extremity: Secondary | ICD-10-CM | POA: Diagnosis not present

## 2022-12-28 ENCOUNTER — Encounter (HOSPITAL_COMMUNITY): Payer: Self-pay | Admitting: Psychiatry

## 2022-12-28 ENCOUNTER — Telehealth (INDEPENDENT_AMBULATORY_CARE_PROVIDER_SITE_OTHER): Payer: BC Managed Care – PPO | Admitting: Psychiatry

## 2022-12-28 DIAGNOSIS — F4310 Post-traumatic stress disorder, unspecified: Secondary | ICD-10-CM

## 2022-12-28 DIAGNOSIS — F411 Generalized anxiety disorder: Secondary | ICD-10-CM | POA: Diagnosis not present

## 2022-12-28 DIAGNOSIS — F332 Major depressive disorder, recurrent severe without psychotic features: Secondary | ICD-10-CM | POA: Diagnosis not present

## 2022-12-28 DIAGNOSIS — I10 Essential (primary) hypertension: Secondary | ICD-10-CM

## 2022-12-28 DIAGNOSIS — Z758 Other problems related to medical facilities and other health care: Secondary | ICD-10-CM

## 2022-12-28 DIAGNOSIS — F17290 Nicotine dependence, other tobacco product, uncomplicated: Secondary | ICD-10-CM | POA: Diagnosis not present

## 2022-12-28 DIAGNOSIS — F410 Panic disorder [episodic paroxysmal anxiety] without agoraphobia: Secondary | ICD-10-CM

## 2022-12-28 DIAGNOSIS — G8929 Other chronic pain: Secondary | ICD-10-CM

## 2022-12-28 MED ORDER — DULOXETINE HCL 40 MG PO CPEP: 40.0000 mg | ORAL_CAPSULE | Freq: Every day | ORAL | 2 refills | Status: DC
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
## 2022-12-28 NOTE — Patient Instructions (Signed)
We didn't make any medication changes today. Keep up the good work cutting back on vaping and as soon as you can to go over your elevated blood pressure.

## 2023-01-17 ENCOUNTER — Ambulatory Visit (INDEPENDENT_AMBULATORY_CARE_PROVIDER_SITE_OTHER): Payer: BC Managed Care – PPO | Admitting: Clinical

## 2023-01-17 DIAGNOSIS — F410 Panic disorder [episodic paroxysmal anxiety] without agoraphobia: Secondary | ICD-10-CM | POA: Diagnosis not present

## 2023-01-17 DIAGNOSIS — F411 Generalized anxiety disorder: Secondary | ICD-10-CM

## 2023-01-17 DIAGNOSIS — F332 Major depressive disorder, recurrent severe without psychotic features: Secondary | ICD-10-CM | POA: Diagnosis not present

## 2023-01-17 DIAGNOSIS — F4310 Post-traumatic stress disorder, unspecified: Secondary | ICD-10-CM

## 2023-01-17 NOTE — Progress Notes (Signed)
IN PERSON   I connected with Jeffrey Hoover on 01/17/23 at 1:00 PM EDT in person and verified that I am speaking with the correct person using two identifiers.   Location: Patient: Office Provider: Office   I discussed the limitations of evaluation and management by telemedicine and the availability of in person appointments. The patient expressed understanding and agreed to proceed. ( IN PERSON)     THERAPIST PROGRESS NOTE   Session Time: 1:00 PM-1:30 PM   Participation Level: Active   Behavioral Response: CasualAlertAnxious   Type of Therapy: Individual Therapy   Treatment Goals addressed: Coping for Anxiety/PTSD/Depression   Interventions: CBT, Motivational Interviewing, Solution Focused and Supportive   Summary: Jeffrey Hoover is a 37 y.o. male who presents with MDD/GAD/PTSD . The OPT therapist worked with the patient for his scheduled session. The OPT therapist utilized Motivational Interviewing to assist in creating therapeutic repore. The patient spoke about changes since his last session that have been beneficial including leaving/bought out from his work place allowing him 3 months of salary and time to make changes in his employment. 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 The patient will be leaving July 10th-20th flying internationally to [place] The patient spoke about working in the home doing in home projects, but still is having difficulty focusing and completing task. The patient spoke about his med management and will most likely move to a higher dose with his MH medication before the end of June and will be doing a trial period at 60mg  instead of the 40 mg he is currently taking.. The OPT therapist overviewed patient basic need areas including sleep cycle, eating habits, hygiene, and physical exercise. The OPT therapist overviewed coping strategies to further assist the  patient in management of his MH symptoms. The OPT therapist over-viewed with the patient upcoming appointments as listed in his MyChart.    Suicidal/Homicidal: Nowithout intent/plan   Therapist Response: The OPT therapist worked with the patient for the patients scheduled session. The patient was engaged in his session and gave feedback in relation to triggers, symptoms, and behavior responses over the past few weeks. The OPT therapist worked with the patient utilizing an in session Cognitive Behavioral Therapy exercise. The patient was responsive in the session and verbalized," I have definitely been a lot less stressed and anxious since leaving work," The OPT therapist worked with the patient overviewing his support symptoms and  implementation of coping strategies including leveraging seasonal coping strategies.  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 The OPT therapist worked with the patient overviewing appointments listed in the patients MyChart.  The OPT therapist will continue treatment work with the patient in his next scheduled session.     Plan: Return again in 2/3 weeks.   Diagnosis:      Axis I: GAD/PTSD/MDD                           Axis II: No diagnosis     Collaboration of Care: Overview of patient involvement in the med therapy program with Dr. Adrian Blackwater.   Patient/Guardian was advised Release of Information must be obtained prior to any record release in  order to collaborate their care with an outside provider. Patient/Guardian was advised if they have not already done so to contact the registration department to sign all necessary forms in order for [place] to release information regarding their care.    Consent: Patient/Guardian gives verbal consent for treatment and assignment  of benefits for services provided during this visit. Patient/Guardian expressed understanding and agreed to proceed.    I discussed the assessment and treatment plan with the patient. The patient was provided an opportunity to ask questions and all were answered. The patient agreed with the plan and demonstrated an understanding of the instructions.   The patient was advised to call back or seek an in-person evaluation if the symptoms worsen or if the condition fails to improve as anticipated.   I provided 30 minutes of face-to-face time during this encounter.   Winfred Burn, LCSW    01/17/2023

## 2023-02-01 ENCOUNTER — Ambulatory Visit: Payer: BC Managed Care – PPO | Admitting: Orthopedic Surgery

## 2023-02-01 ENCOUNTER — Encounter: Payer: Self-pay | Admitting: Orthopedic Surgery

## 2023-02-01 VITALS — BP 175/100 | HR 96 | Ht 73.0 in | Wt 279.0 lb

## 2023-02-01 DIAGNOSIS — M7660 Achilles tendinitis, unspecified leg: Secondary | ICD-10-CM | POA: Diagnosis not present

## 2023-02-01 DIAGNOSIS — M7731 Calcaneal spur, right foot: Secondary | ICD-10-CM

## 2023-02-01 NOTE — Patient Instructions (Signed)
While we are working on your approval for MRI please go ahead and call to schedule your appointment with [facility] within at least one (1) week.   Central Scheduling [phone]  

## 2023-02-01 NOTE — Progress Notes (Signed)
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  I took x-rays on him last year and he had calcification and large Haglund's type posterior calcaneal deformity  There were calcifications in the tendon itself  Repeat examination  BP (!) 175/100   Pulse 96   Ht 6\' 1"  (1.854 m)   Wt 279 lb (126.6 kg)   BMI 36.81 kg/m   Right ankle swelling around the Achilles tendon no palpable defect there is tenderness in this area.  It is tight in terms of his range of motion.  There is no instability  Similar but less significant examination noted on the left side  Recommend MRI of the right ankle.  In my opinion this will require detachment of the Achilles tendon removal of the bone spurs and debridement of the tendon and then reattachment after reshaping the dorsum of the heel.  I had led to him that this would probably be a 43-month recovery but he is interested in doing it as his pain and loss of function have increased significantly where it is causing him quite a bit of problem  After MRI we will set up an appointment for him to see Dr. Lajoyce Corners

## 2023-02-02 ENCOUNTER — Encounter (HOSPITAL_COMMUNITY): Payer: Self-pay

## 2023-02-05 ENCOUNTER — Other Ambulatory Visit (HOSPITAL_COMMUNITY): Payer: Self-pay | Admitting: Psychiatry

## 2023-02-05 ENCOUNTER — Telehealth (HOSPITAL_COMMUNITY): Payer: Self-pay

## 2023-02-05 DIAGNOSIS — G8929 Other chronic pain: Secondary | ICD-10-CM

## 2023-02-05 DIAGNOSIS — F331 Major depressive disorder, recurrent, moderate: Secondary | ICD-10-CM

## 2023-02-05 DIAGNOSIS — F4310 Post-traumatic stress disorder, unspecified: Secondary | ICD-10-CM

## 2023-02-05 DIAGNOSIS — F410 Panic disorder [episodic paroxysmal anxiety] without agoraphobia: Secondary | ICD-10-CM

## 2023-02-05 MED ORDER — DULOXETINE HCL 60 MG PO CPEP: 60.0000 mg | ORAL_CAPSULE | Freq: Every day | ORAL | 1 refills | Status: DC

## 2023-02-05 NOTE — Progress Notes (Signed)
Cymbalta dose increased from 40 mg to 60 mg to improve affordability as patient's insurance was not wanting to cover the 40 mg dose.

## 2023-02-05 NOTE — Telephone Encounter (Signed)
Called pt to schedule follow up with Dr Adrian Blackwater no answer left vm

## 2023-02-07 ENCOUNTER — Telehealth: Payer: Self-pay

## 2023-02-07 NOTE — Telephone Encounter (Signed)
Dr. Mort Sawyers pt------------Debra with [employer] of [place] left message stating that patient wants to go to another facility for his MRI. She stated [facility] with fax # [phone] and left authorization # [id number].  Please call patient and discuss

## 2023-02-08 ENCOUNTER — Ambulatory Visit (HOSPITAL_COMMUNITY): Payer: BC Managed Care – PPO

## 2023-02-08 NOTE — Telephone Encounter (Signed)
Changed location of study to [facility] also

## 2023-02-08 NOTE — Telephone Encounter (Signed)
Get recording but unable to leave message I have messaged  imaging to call him.

## 2023-02-10 ENCOUNTER — Ambulatory Visit
Admission: RE | Admit: 2023-02-10 | Discharge: 2023-02-10 | Disposition: A | Discharge status: Home / Self Care | Payer: BC Managed Care – PPO | Source: Ambulatory Visit | Attending: Orthopedic Surgery | Admitting: Orthopedic Surgery

## 2023-02-10 DIAGNOSIS — S86311A Strain of muscle(s) and tendon(s) of peroneal muscle group at lower leg level, right leg, initial encounter: Secondary | ICD-10-CM | POA: Diagnosis not present

## 2023-02-10 DIAGNOSIS — M7751 Other enthesopathy of right foot: Secondary | ICD-10-CM | POA: Diagnosis not present

## 2023-02-10 DIAGNOSIS — M7661 Achilles tendinitis, right leg: Secondary | ICD-10-CM | POA: Diagnosis not present

## 2023-02-10 DIAGNOSIS — M7731 Calcaneal spur, right foot: Secondary | ICD-10-CM

## 2023-02-10 DIAGNOSIS — M7660 Achilles tendinitis, unspecified leg: Secondary | ICD-10-CM

## 2023-02-28 DIAGNOSIS — L720 Epidermal cyst: Secondary | ICD-10-CM | POA: Diagnosis not present

## 2023-02-28 DIAGNOSIS — D1722 Benign lipomatous neoplasm of skin and subcutaneous tissue of left arm: Secondary | ICD-10-CM | POA: Diagnosis not present

## 2023-02-28 DIAGNOSIS — L281 Prurigo nodularis: Secondary | ICD-10-CM | POA: Diagnosis not present

## 2023-03-01 ENCOUNTER — Ambulatory Visit: Payer: BC Managed Care – PPO | Admitting: Orthopedic Surgery

## 2023-03-01 DIAGNOSIS — M6788 Other specified disorders of synovium and tendon, other site: Secondary | ICD-10-CM | POA: Diagnosis not present

## 2023-03-01 MED ORDER — PREDNISONE 10 MG PO TABS
10.0000 mg | ORAL_TABLET | Freq: Three times a day (TID) | ORAL | 0 refills | Status: AC
Start: 2023-03-01 — End: ?

## 2023-03-01 NOTE — Patient Instructions (Addendum)
We are referring you to [facility] from [facility] address is [address] [place] The phone number is [phone]  The office will call you with an appointment Dr. Shon Baton for the shock wave evaluation     [facility] in [place] Address: [address] [place], [place], [place] [address] Phone: [phone] Their office will also call you with appointment

## 2023-03-01 NOTE — Progress Notes (Signed)
MRI RESULTS FOLLOW UP   Encounter Diagnosis  Name Primary?   Achilles tendonosis Yes    Chief Complaint  Patient presents with   Ankle Pain    R/ here to go over MRI. It hurts and want to find out my options.   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  I have recommended he be referred to the foot and ankle specialist for further evaluation and management..  However, the patient and his girlfriend would like him to see a podiatrist and also to see someone about shockwave therapy  So we will accommodate his request  He also asked about further treatments as ibuprofen has not helped.  Meloxicam has not helped.  I offered him a cam walker but he cannot really work trying to use a cam walker.  So we did put him on some steroids orally.  Meds ordered this encounter  Medications   predniSONE (DELTASONE) 10 MG tablet    Sig: Take 1 tablet (10 mg total) by mouth 3 (three) times daily.    Dispense:  42 tablet    Refill:  0   He has a lot of trouble walking and he is here for evaluation.  I took x-rays on him last year and he had calcification and large Haglund's type posterior calcaneal deformity   There were calcifications in the tendon itself  He was on anti-inflammatories for his tennis elbow but it did not help his tendon issue   His exam findings were:Right ankle swelling around the Achilles tendon no palpable defect there is tenderness in this area.  It is tight in terms of his range of motion.  There is no instability   Similar but less significant examination noted on the left side    MY READING OF THE MRI   It looks like he has tendinitis in his Achilles tendon with retrocalcaneal bursitis and then split tear in the peroneals  MRI REPORT:    IMPRESSION: 1.  Moderate tendinosis of the Achilles tendon with subcortical reactive marrow edema at the calcaneal insertion. Severe retrocalcaneal bursitis. 2. Moderate tendinosis of the peroneus longus with a small interstitial tear at the level of the lateral malleolus. Mild peroneal tenosynovitis. 3. Mild tenosynovitis of the posterior tibial tendon.     Electronically Signed   By: Elige Ko M.D.   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  I have recommended he be referred to the foot and ankle specialist for further evaluation and management..  However, the patient and his girlfriend would like him to see a podiatrist and also to see someone about shockwave therapy  So we will accommodate his request  He also asked about further treatments as ibuprofen has not helped.  Meloxicam has not helped.  I offered him a cam walker but he cannot really work trying to use a cam walker.  So we did put him on some steroids orally.  Meds ordered this encounter  Medications   predniSONE (DELTASONE) 10 MG tablet    Sig: Take 1 tablet (10 mg total) by mouth 3 (three) times daily.    Dispense:  42 tablet    Refill:  0

## 2023-03-07 ENCOUNTER — Ambulatory Visit (HOSPITAL_COMMUNITY): Payer: BC Managed Care – PPO | Admitting: Clinical

## 2023-03-07 DIAGNOSIS — F331 Major depressive disorder, recurrent, moderate: Secondary | ICD-10-CM | POA: Diagnosis not present

## 2023-03-07 DIAGNOSIS — F411 Generalized anxiety disorder: Secondary | ICD-10-CM | POA: Diagnosis not present

## 2023-03-07 DIAGNOSIS — F410 Panic disorder [episodic paroxysmal anxiety] without agoraphobia: Secondary | ICD-10-CM

## 2023-03-07 DIAGNOSIS — F4310 Post-traumatic stress disorder, unspecified: Secondary | ICD-10-CM

## 2023-03-07 NOTE — Progress Notes (Signed)
IN PERSON   I connected with Jeffrey Hoover on 03/07/23 at 11:00 AM EDT in person and verified that I am speaking with the correct person using two identifiers.   Location: Patient: Office Provider: Office   I discussed the limitations of evaluation and management by telemedicine and the availability of in person appointments. The patient expressed understanding and agreed to proceed. ( IN PERSON)     THERAPIST PROGRESS NOTE   Session Time: 11:00 AM-11:45 AM   Participation Level: Active   Behavioral Response: CasualAlertAnxious   Type of Therapy: Individual Therapy   Treatment Goals addressed: Coping for Anxiety/PTSD/Depression   Interventions: CBT, Motivational Interviewing, Solution Focused and Supportive   Summary: Jeffrey Hoover is a 37 y.o. male who presents with MDD/GAD/PTSD . The OPT therapist worked with the patient for his scheduled session. The OPT therapist utilized Motivational Interviewing to assist in creating therapeutic repore. 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 The patient returned from business trip to [place] and continues working on logistics state side in the [place]. The patient spoke about working in the home doing in home projects, but still is having difficulty focusing and completing task. The patient spoke about his med management and looking to make a change in his next med therapy appointment.. The OPT therapist overviewed patient basic need areas including sleep cycle, eating habits, hygiene, and physical exercise. The OPT therapist overviewed coping strategies to further assist the patient in management of his MH symptoms. The OPT therapist over-viewed with the patient upcoming appointments as listed in his MyChart.    Suicidal/Homicidal: Nowithout intent/plan   Therapist Response: The OPT therapist worked with the patient for the patients scheduled session. The  patient was engaged in his session and gave feedback in relation to triggers, symptoms, and behavior responses over the past few weeks. The OPT therapist worked with the patient utilizing an in session Cognitive Behavioral Therapy exercise. The patient was responsive in the session and verbalized," The trip to [place] went very well and we are moving forward with the logistics" The OPT therapist worked with the patient overviewing his support symptoms and  implementation of coping strategies including leveraging seasonal coping strategies.  The patient identified ongoing work in going into business for himself with goals over the course of the next 6 months..The OPT therapist overviewed with the patient his interactions with his wife since getting back from [place] and noted he still does not feel his medication is working and looks to review with Dr. Adrian Blackwater at his next med management appointment. The OPT therapist worked with the patient overviewing appointments listed in the patients MyChart.  The OPT therapist will continue treatment work with the patient in his next scheduled session.     Plan: Return again in 2/3 weeks.   Diagnosis:      Axis I: GAD/PTSD/MDD                           Axis II: No diagnosis     Collaboration of Care: Overview of patient involvement in the med therapy program with Dr. Adrian Blackwater.   Patient/Guardian was advised Release of Information must be obtained prior to any record release in order to collaborate their care with an outside provider. Patient/Guardian was advised if they have not already done so to contact the registration department to sign all necessary forms in order  for [place] to release information regarding their care.    Consent: Patient/Guardian gives verbal consent for treatment and assignment of benefits for services provided during this visit. Patient/Guardian expressed understanding and agreed to proceed.    I discussed the assessment and treatment plan with  the patient. The patient was provided an opportunity to ask questions and all were answered. The patient agreed with the plan and demonstrated an understanding of the instructions.   The patient was advised to call back or seek an in-person evaluation if the symptoms worsen or if the condition fails to improve as anticipated.   I provided 55 minutes of face-to-face time during this encounter.   Winfred Burn, LCSW    03/07/2023

## 2023-03-09 ENCOUNTER — Telehealth (INDEPENDENT_AMBULATORY_CARE_PROVIDER_SITE_OTHER): Payer: BC Managed Care – PPO | Admitting: Psychiatry

## 2023-03-09 ENCOUNTER — Encounter (HOSPITAL_COMMUNITY): Payer: Self-pay | Admitting: Psychiatry

## 2023-03-09 DIAGNOSIS — F1491 Cocaine use, unspecified, in remission: Secondary | ICD-10-CM

## 2023-03-09 DIAGNOSIS — I10 Essential (primary) hypertension: Secondary | ICD-10-CM

## 2023-03-09 DIAGNOSIS — E559 Vitamin D deficiency, unspecified: Secondary | ICD-10-CM

## 2023-03-09 DIAGNOSIS — F410 Panic disorder [episodic paroxysmal anxiety] without agoraphobia: Secondary | ICD-10-CM

## 2023-03-09 DIAGNOSIS — F332 Major depressive disorder, recurrent severe without psychotic features: Secondary | ICD-10-CM | POA: Diagnosis not present

## 2023-03-09 DIAGNOSIS — F331 Major depressive disorder, recurrent, moderate: Secondary | ICD-10-CM

## 2023-03-09 DIAGNOSIS — F411 Generalized anxiety disorder: Secondary | ICD-10-CM

## 2023-03-09 DIAGNOSIS — F17290 Nicotine dependence, other tobacco product, uncomplicated: Secondary | ICD-10-CM

## 2023-03-09 DIAGNOSIS — F1691 Hallucinogen use, unspecified, in remission: Secondary | ICD-10-CM

## 2023-03-09 DIAGNOSIS — F1291 Cannabis use, unspecified, in remission: Secondary | ICD-10-CM

## 2023-03-09 DIAGNOSIS — G8929 Other chronic pain: Secondary | ICD-10-CM | POA: Diagnosis not present

## 2023-03-09 DIAGNOSIS — F1091 Alcohol use, unspecified, in remission: Secondary | ICD-10-CM

## 2023-03-09 DIAGNOSIS — F4310 Post-traumatic stress disorder, unspecified: Secondary | ICD-10-CM | POA: Diagnosis not present

## 2023-03-09 DIAGNOSIS — Z758 Other problems related to medical facilities and other health care: Secondary | ICD-10-CM

## 2023-03-09 MED ORDER — DULOXETINE HCL 60 MG PO CPEP
60.0000 mg | ORAL_CAPSULE | Freq: Every day | ORAL | 1 refills | Status: DC
Start: 2023-03-09 — End: 2023-04-10
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
## 2023-03-09 NOTE — Patient Instructions (Addendum)
We did not make any medication changes today.  We did make a referral to [facility] and you can reach them at: [phone].  I also made a referral to the family medicine this that shares a building with this year on [address] in [place].

## 2023-03-12 ENCOUNTER — Encounter (HOSPITAL_COMMUNITY): Payer: Self-pay

## 2023-03-15 ENCOUNTER — Ambulatory Visit (INDEPENDENT_AMBULATORY_CARE_PROVIDER_SITE_OTHER): Payer: BC Managed Care – PPO | Admitting: Sports Medicine

## 2023-03-15 ENCOUNTER — Encounter: Payer: Self-pay | Admitting: Sports Medicine

## 2023-03-15 DIAGNOSIS — M79671 Pain in right foot: Secondary | ICD-10-CM | POA: Diagnosis not present

## 2023-03-15 DIAGNOSIS — M6788 Other specified disorders of synovium and tendon, other site: Secondary | ICD-10-CM | POA: Diagnosis not present

## 2023-03-15 DIAGNOSIS — M9262 Juvenile osteochondrosis of tarsus, left ankle: Secondary | ICD-10-CM

## 2023-03-15 DIAGNOSIS — M79672 Pain in left foot: Secondary | ICD-10-CM

## 2023-03-15 DIAGNOSIS — M7751 Other enthesopathy of right foot: Secondary | ICD-10-CM

## 2023-03-15 DIAGNOSIS — M9261 Juvenile osteochondrosis of tarsus, right ankle: Secondary | ICD-10-CM | POA: Diagnosis not present

## 2023-03-15 NOTE — Progress Notes (Signed)
Bilateral achilles pain. Right worse than left. +swelling

## 2023-03-15 NOTE — Progress Notes (Signed)
Jeffrey Hoover - 37 y.o. male MRN [id number]  Date of birth: [birth year]  Office Visit Note: Visit Date: 03/15/2023 PCP: Jeffrey Hoover Referred by: Jeffrey Hearing, MD  Subjective: Chief Complaint  Patient presents with   Left Ankle - Pain   Right Ankle - Pain   HPI: Jeffrey Hoover is a pleasant 37 y.o. male who presents today for bilateral heel pain, R > L.  Jeffrey Hoover has had bilateral, right greater than left heel and posterior ankle pain for about 8 years or more.  He is over the last few months to years his pain has suddenly worsened.  Has been seen by partner of pain bleeding, Jeffrey Hoover -note reviewed.  See that he recommended likely surgical intervention.  Jeffrey Hoover would like to ascertain all conservative treatment options at this time.  Treatments tried: -Multiple over-the-counter insoles, heel lifts, oral indomethacin without much relief. -He is currently performing home exercises for the Achilles stretching and strengthening about 4 times weekly with some relief of his pain; currently on prednisone  He does have an upcoming appointment with podiatry next week as well.  Pertinent ROS were reviewed with the patient and found to be negative unless otherwise specified above in HPI.   Assessment & Plan: Visit Diagnoses:  1. Heel pain, bilateral   2. Retrocalcaneal bursitis (back of heel), right   3. Achilles tendonosis   4. Haglund's deformity of both heels    Plan: Discussed with Jeffrey Hoover that his bilateral ankle pain is multifactorial as he does have Haglund deformity as well as spurring of the calcaneus with insertional Achilles tendinopathy.  He also has a significant retrocalcaneal bursitis of the right ankle.  He Jeffrey continue his home therapy for stretching and strengthening.  He is currently on prednisone, Jeffrey like him to complete this course.  We discussed trial of extracorporeal shockwave therapy, did proceed with this today bilaterally, he tolerated well.  We Jeffrey  see him back in about 1 week and repeat treatment, we Jeffrey see the degree of improvement and consider future additional treatments if noting benefit.  He did have a somewhat immediate benefit with improved range of motion directly after treatment today.  I did discuss possible brief shutdown in a cam walker boot.  Other options would be a one-time retrocalcaneal bursa injection given his bursitis. These are options short of surgical intervention (Jeffrey Hoover).   Follow-up: Return in about 1 week (around 03/22/2023) for for bilateral heels (reg visit).   Meds & Orders: Hoover orders of the defined types were placed in this encounter.  Hoover orders of the defined types were placed in this encounter.    Procedures: Procedure: ECSWT Indications:  Achilles tendinitis, Retrocalcaneal bursitis   Procedure Details Consent: Risks of procedure as well as the alternatives and risks of each were explained to the patient.  Verbal consent for procedure obtained. Time Out: Verified patient identification, verified procedure, site was marked, verified correct patient position. The area was cleaned with alcohol swab.     The right achilles and superior calcaneus was targeted for Extracorporeal shockwave therapy.    Preset: Achillodynia Power Level: 110-120 mJ Frequency: 11-12 Hz Impulse/cycles: 2800 (200 on left and right of RC-bursa) Head size: Regular   Patient tolerated procedure well without immediate complications.  The left achilles and superior calcaneus was targeted for Extracorporeal shockwave therapy.    Preset: Achillodynia Power Level: 120 mJ Frequency: 12 Hz Impulse/cycles: 2800 Head size: Regular   Patient tolerated procedure  well without immediate complications.         Clinical History: Hoover specialty comments available.  He reports that he has been smoking cigarettes and e-cigarettes. He has quit using smokeless tobacco.  His smokeless tobacco use included chew.  Recent Labs     09/19/22 0800  HGBA1C 5.3    Objective:   Vital Signs: There were Hoover vitals taken for this visit.  Physical Exam  Gen: Well-appearing, in Hoover acute distress; non-toxic CV: Well-perfused. Warm.  Resp: Breathing unlabored on room air; Hoover wheezing. Psych: Fluid speech in conversation; appropriate affect; normal thought process Neuro: Sensation intact throughout. Hoover gross coordination deficits.   Ortho Exam - Bilateral feet/ankles: Evaluation of the right foot/ankle shows a moderate-high longitudinal arch.  There is significant bossing over the superior aspect of the calcaneus with tenderness to palpation.  There is a mild warmth and swelling over the retrocalcaneal bursa region.  Dorsiflexion to approximately 100 degrees.  Neurovascular intact distally.  There is xerosis of the skin over the heel.  The left foot/ankle shows a moderate to high longitudinal arch, there is some bony bossing over the superior calcaneus with likely Haglund's, mildly tender to palpation.  Hoover significant swelling or effusion of the ankle.  Imaging:  Ankle XR's from 06/12/22:  Narrative & Impression  X-ray left ankle chronic pain and swelling with posterior pain and enlargement/mass posterior aspect of the calcaneus   The patient has what would be considered a fairly high arch, he has large osteophytes and calcification as well as prominence of the superior angle of the calcaneus in and around the Achilles tendon   Impression High arch Haglund's type calcaneal prominence Calcification in the Achilles tendon    Narrative & Impression  X-ray right ankle   Chronic posterior ankle pain with swelling around the ankle   The ankle joint itself is normal.  As we saw on the left side, ankle x-ray did also show as this 1 does a prominence of the superior portion of the calcaneus with Haglund's type IV deformity there is an osteophyte at at the Achilles insertion with some calcification in the tendon   Impression    Calcification of the Achilles tendon insertion Osteophyte calcaneus Haglund's type superior deformity     *I independently interpreted and reviewed the MRI of the ankle today.  There is mid to distal Achilles tendinopathy with superimposed subcortical edema over the superior calcaneus.  There is bony bossing indicative of Haglund's deformity.  Large retrocalcaneal bursitis.  MR ANKLE RIGHT WO CONTRAST CLINICAL DATA:  Achilles tendinitis, chronic pain and swelling  EXAM: MRI OF THE RIGHT ANKLE WITHOUT CONTRAST  TECHNIQUE: Multiplanar, multisequence MR imaging of the ankle was performed. Hoover intravenous contrast was administered.  COMPARISON:  None Available.  FINDINGS: TENDONS  Peroneal: Moderate tendinosis of the peroneus longus with a small interstitial tear at the level of the lateral malleolus. Mild peroneal tenosynovitis. Peroneal brevis intact.  Posteromedial: Posterior tibial tendon intact. Flexor hallucis longus tendon intact. Flexor digitorum longus tendon intact. Mild tenosynovitis of the posterior tibial tendon.  Anterior: Tibialis anterior tendon intact. Extensor hallucis longus tendon intact Extensor digitorum longus tendon intact.  Achilles: Moderate tendinosis of the Achilles tendon with subcortical reactive marrow edema at the calcaneal insertion. Severe retrocalcaneal bursitis.  Plantar Fascia: Intact.  LIGAMENTS  Lateral: Anterior talofibular ligament intact. Calcaneofibular ligament intact. Posterior talofibular ligament intact. Anterior and posterior tibiofibular ligaments intact.  Medial: Deltoid ligament intact. Spring ligament intact.  CARTILAGE  Ankle Joint:  Hoover joint effusion. Normal ankle mortise. Hoover chondral defect.  Subtalar Joints/Sinus Tarsi: Normal subtalar joints. Hoover subtalar joint effusion. Normal sinus tarsi.  Bones: Hoover aggressive osseous lesion. Hoover fracture or dislocation.  Soft Tissue: Hoover fluid collection or hematoma. Muscles  are normal without edema or atrophy. Tarsal tunnel is normal.  IMPRESSION: 1. Moderate tendinosis of the Achilles tendon with subcortical reactive marrow edema at the calcaneal insertion. Severe retrocalcaneal bursitis. 2. Moderate tendinosis of the peroneus longus with a small interstitial tear at the level of the lateral malleolus. Mild peroneal tenosynovitis. 3. Mild tenosynovitis of the posterior tibial tendon.  Electronically Signed   By: Elige Ko M.D.   On: 02/18/2023 06:54    Past Medical/Family/Surgical/Social History: Medications & Allergies reviewed per EMR, new medications updated. 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 Social History   Occupational History   Not on file  Tobacco Use   Smoking status: Every Day    Current packs/day: 0.00    Types: Cigarettes, E-cigarettes    Last attempt to quit: 2020    Years since quitting: 4.5   Smokeless tobacco: Former    Types: Chew   Tobacco comments:    Vape bottle lasts 1 month as of 12/28/22   Vaping Use   Vaping status: Every Day   Substances: Nicotine-salt  Substance and Sexual Activity   Alcohol use: Not Currently    Comment: Previously half of fifth of vodka 4 out of 7 nights per week none in the last 5 years   Drug use: Not Currently    Types: Cocaine, Marijuana, Psilocybin    Comment: Last used in 2017-2018   Sexual activity: Yes    Partners: Female

## 2023-03-20 ENCOUNTER — Ambulatory Visit: Payer: BC Managed Care – PPO | Admitting: Podiatry

## 2023-03-20 DIAGNOSIS — M62462 Contracture of muscle, left lower leg: Secondary | ICD-10-CM

## 2023-03-20 DIAGNOSIS — S86011A Strain of right Achilles tendon, initial encounter: Secondary | ICD-10-CM

## 2023-03-20 DIAGNOSIS — M62461 Contracture of muscle, right lower leg: Secondary | ICD-10-CM | POA: Diagnosis not present

## 2023-03-20 DIAGNOSIS — M6788 Other specified disorders of synovium and tendon, other site: Secondary | ICD-10-CM | POA: Diagnosis not present

## 2023-03-20 DIAGNOSIS — M9261 Juvenile osteochondrosis of tarsus, right ankle: Secondary | ICD-10-CM

## 2023-03-20 DIAGNOSIS — M7731 Calcaneal spur, right foot: Secondary | ICD-10-CM

## 2023-03-20 MED ORDER — DICLOFENAC SODIUM 75 MG PO TBEC: 75.0000 mg | DELAYED_RELEASE_TABLET | Freq: Two times a day (BID) | ORAL | 2 refills | Status: DC

## 2023-03-20 NOTE — Progress Notes (Signed)
Subjective:  Patient ID: Jeffrey Hoover, male    DOB: [birth year],  MRN: [id number]  Chief Complaint  Patient presents with   Tendonitis    Pain in both ankles that's been going on for a while now, worse in the right ankle.    37 y.o. male presents with the above complaint. History confirmed with patient.  He has been dealing with this for nearly a years now.  It has been worsening recently and has been undergoing treatment at [facility] orth[facility], they completed an MRI as well as x-rays.  He is undergoing shockwave therapy to both heels.  The left is beginning to worsen as well but has not been nearly as severe.  He works in [employer], has some international business travel as well.  Has been difficult to carry out his job duties.  Due to his nature of work, has not been able to be immobilized in a cam walker boot.  Has not had physical therapy.  Objective:  Physical Exam: warm, good capillary refill, no trophic changes or ulcerative lesions, normal DP and PT pulses, normal sensory exam, and bilateral heels with right worse than left in severity is pain swelling edema and palpable bony spurring on the posterior Achilles and heel at the insertion, he has gastrocnemius equinus bilaterally   Radiographs: Previous right ankle radiographs taken 06/13/2022 show bilateral retrocalcaneal spurring with Haglund deformity  Had an MRI completed 02/10/2023 shows Study Result  Narrative & Impression  CLINICAL DATA:  Achilles tendinitis, chronic pain and swelling   EXAM: MRI OF THE RIGHT ANKLE WITHOUT CONTRAST   TECHNIQUE: Multiplanar, multisequence MR imaging of the ankle was performed. No intravenous contrast was administered.   COMPARISON:  None Available.   FINDINGS: TENDONS   Peroneal: Moderate tendinosis of the peroneus longus with a small interstitial tear at the level of the lateral malleolus. Mild peroneal tenosynovitis. Peroneal brevis intact.   Posteromedial: Posterior  tibial tendon intact. Flexor hallucis longus tendon intact. Flexor digitorum longus tendon intact. Mild tenosynovitis of the posterior tibial tendon.   Anterior: Tibialis anterior tendon intact. Extensor hallucis longus tendon intact Extensor digitorum longus tendon intact.   Achilles: Moderate tendinosis of the Achilles tendon with subcortical reactive marrow edema at the calcaneal insertion. Severe retrocalcaneal bursitis.   Plantar Fascia: Intact.   LIGAMENTS   Lateral: Anterior talofibular ligament intact. Calcaneofibular ligament intact. Posterior talofibular ligament intact. Anterior and posterior tibiofibular ligaments intact.   Medial: Deltoid ligament intact. Spring ligament intact.   CARTILAGE   Ankle Joint: No joint effusion. Normal ankle mortise. No chondral defect.   Subtalar Joints/Sinus Tarsi: Normal subtalar joints. No subtalar joint effusion. Normal sinus tarsi.   Bones: No aggressive osseous lesion. No fracture or dislocation.   Soft Tissue: No fluid collection or hematoma. Muscles are normal without edema or atrophy. Tarsal tunnel is normal.   IMPRESSION: 1. Moderate tendinosis of the Achilles tendon with subcortical reactive marrow edema at the calcaneal insertion. Severe retrocalcaneal bursitis. 2. Moderate tendinosis of the peroneus longus with a small interstitial tear at the level of the lateral malleolus. Mild peroneal tenosynovitis. 3. Mild tenosynovitis of the posterior tibial tendon.     Electronically Signed   By: Elige Ko M.D.   On: 02/18/2023 06:54   Assessment:   1. Achilles tendinosis of both lower extremities   2. Gastrocnemius equinus of left lower extremity   3. Gastrocnemius equinus of right lower extremity   4. Haglund's deformity of right heel   5. Partial tear  of Achilles tendon, right, initial encounter   6. Bone spur of posterior portion of right calcaneus      Plan:  Patient was evaluated and treated and all  questions answered.  I reviewed his radiographs of both ankles as well as the most recent right ankle MRI for his Achilles tendinosis that has been ongoing and severe for some time.  He has had minimal improvement.  He recently completed a initial treatment of EPAT said he did note some improvement with this.  I think this is worth continuing treatment on both heels.  His right tendon on the MRI is quite severe and does show a partial tear in the anterior fibers at the insertion, he may have limited improvement in this due to this.  I do not think corticosteroid injection would be warranted without proper immobilization which is not possible for him due to his job constraints right now, likely to increase chance of insertional rupture.  Long-term I expect he will require surgical intervention with excision of the retrocalcaneal spur, debridement of the tendon with detachment and reattachment, resection of the Haglund deformity and possible FHL transfer depending upon the integrity of the remaining tendon.  Hopefully his left side is not severe and will have success with nonoperative treatment, I discussed continuing his shockwave treatment, I do think he may benefit from a PRP injection on both sides as well.  I also think physical therapy for strengthening and conditioning and improvement in range of motion may be helpful and I have placed a referral for this for [facility] [place].  He will let me know if he would like to proceed with a PRP injection.  Due to his work schedule likely would not be able to even consider surgery until next bring her summer.  Rx for diclofenac gel sent to pharmacy.  Return in about 2 months (around 05/20/2023) for f/u Achilles bilateral.

## 2023-03-21 DIAGNOSIS — M25675 Stiffness of left foot, not elsewhere classified: Secondary | ICD-10-CM | POA: Diagnosis not present

## 2023-03-21 DIAGNOSIS — M25674 Stiffness of right foot, not elsewhere classified: Secondary | ICD-10-CM | POA: Diagnosis not present

## 2023-03-21 DIAGNOSIS — M25572 Pain in left ankle and joints of left foot: Secondary | ICD-10-CM | POA: Diagnosis not present

## 2023-03-21 DIAGNOSIS — M25571 Pain in right ankle and joints of right foot: Secondary | ICD-10-CM | POA: Diagnosis not present

## 2023-03-22 ENCOUNTER — Encounter: Payer: Self-pay | Admitting: Sports Medicine

## 2023-03-22 ENCOUNTER — Ambulatory Visit: Payer: BC Managed Care – PPO | Admitting: Sports Medicine

## 2023-03-22 DIAGNOSIS — M6788 Other specified disorders of synovium and tendon, other site: Secondary | ICD-10-CM

## 2023-03-22 DIAGNOSIS — M7751 Other enthesopathy of right foot: Secondary | ICD-10-CM

## 2023-03-22 DIAGNOSIS — M9261 Juvenile osteochondrosis of tarsus, right ankle: Secondary | ICD-10-CM

## 2023-03-22 DIAGNOSIS — M9262 Juvenile osteochondrosis of tarsus, left ankle: Secondary | ICD-10-CM | POA: Diagnosis not present

## 2023-03-22 NOTE — Progress Notes (Signed)
Jeffrey Hoover - 37 y.o. male MRN [id number]  Date of birth: 14-Jun-1986  Office Visit Note: Visit Date: 03/22/2023 PCP: Pcp, No Referred by: No ref. provider found  Subjective: Chief Complaint  Patient presents with   Left Heel - Pain   Right Heel - Pain   HPI: Jeffrey Hoover is a pleasant 37 y.o. male who presents today for bilateral heel pain.  Known bilateral Haglund deformity, likely retrocalcaneal bursitis of both heels but MRI confirmed on the right.  Last visit we did do a trial of extracorporeal shockwave therapy, he states this certainly helped calm down his pain but only lasted for about 2 days and then his pain returned.  Did see podiatry as well, they did confirm that his only permanent solution would likely be surgery.  Consider PRP injection and physical therapy.  Pertinent ROS were reviewed with the patient and found to be negative unless otherwise specified above in HPI.   Assessment & Plan: Visit Diagnoses:  1. Haglund's deformity of both heels   2. Achilles tendonosis   3. Retrocalcaneal bursitis (back of heel), right    Plan: Jeffrey Hoover is dealt with chronic bilateral ankle and foot pain given his significant Haglund deformity as well as Achilles insertional tendinopathy.  He also has very significant retrocalcaneal bursitis.  We did repeat a second trial of extracorporeal shockwave therapy.  He will let me know his response and decide on future treatments, we could always increase this to twice weekly treatment to see if we can get him more cumulative benefit.  Ultimately, I think he would be best served seeing the foot and ankle surgeon to proceed with surgery, but he would like to pursue all conservative options before this which I feel is realistic.  He is considering PRP injection, I am happy to do this or he may have this done at podiatry as well.  Follow-up: Return for will let me know response to shockwave; f/u up to [facility].   Meds & Orders: No orders of the  defined types were placed in this encounter.  No orders of the defined types were placed in this encounter.    Procedures: Procedure: ECSWT Indications:  Achilles tendinitis, Retrocalcaneal bursitis   Procedure Details Consent: Risks of procedure as well as the alternatives and risks of each were explained to the patient.  Verbal consent for procedure obtained. Time Out: Verified patient identification, verified procedure, site was marked, verified correct patient position. The area was cleaned with alcohol swab.     The right achilles and superior calcaneus was targeted for Extracorporeal shockwave therapy.    Preset: Achillodynia Power Level: 120 mJ Frequency: 12 Hz Impulse/cycles: 3000  Head size: Regular   Patient tolerated procedure well without immediate complications.   The left achilles and superior calcaneus was targeted for Extracorporeal shockwave therapy.    Preset: Achillodynia Power Level: 120 mJ Frequency: 12 Hz Impulse/cycles: 3000 Head size: Regular   Patient tolerated procedure well without immediate complications.      Clinical History: No specialty comments available.  He reports that he has been smoking cigarettes and e-cigarettes. He has quit using smokeless tobacco.  His smokeless tobacco use included chew.  Recent Labs    09/19/22 0800  HGBA1C 5.3    Objective:   Vital Signs: There were no vitals taken for this visit.  Physical Exam  Gen: Well-appearing, in no acute distress; non-toxic CV:  Well-perfused. Warm.  Resp: Breathing unlabored on room air; no wheezing. Psych:  Fluid speech in conversation; appropriate affect; normal thought process Neuro: Sensation intact throughout. No gross coordination deficits.   Ortho Exam - Bilateral feet/ankles: There is significant bony bossing over the superior aspect of the calcaneus with tenderness to palpation bilaterally.  There is warmth and effusion of the retrocalcaneal bursa bilaterally but left  greater than right.  There is notable xerosis of the skin over the heel.  Sensation to light touch intact.  Imaging: No results found.  Past Medical/Family/Surgical/Social History: Medications & Allergies reviewed per EMR, new medications updated. Patient Active Problem List   Diagnosis Date Noted   PTSD (post-traumatic stress disorder) 09/08/2022   Major depressive disorder, recurrent severe without psychotic features (HCC) 09/08/2022   Chronic pain 09/08/2022   Vaping nicotine dependence, tobacco product 09/08/2022   History of alcohol use disorder 09/08/2022   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   Cannabis use disorder, moderate, in sustained remission (HCC) 09/08/2022   Does not have primary care provider 09/08/2022   Uncontrolled hypertension 09/08/2022   Generalized anxiety disorder with panic attacks 05/14/2017   Past Medical History:  Diagnosis Date   Chronic pain    Chronic suicidal ideation 09/08/2022   Depression    Generalized anxiety disorder with panic attacks    History of alcohol use disorder    Hypertension    Tobacco use disorder    Family History  Problem Relation Age of Onset   Anxiety disorder Mother    Depression Mother    No past surgical history on file. Social History   Occupational History   Not on file  Tobacco Use   Smoking status: Every Day    Current packs/day: 0.00    Types: Cigarettes, E-cigarettes    Last attempt to quit: 2020    Years since quitting: 4.6   Smokeless tobacco: Former    Types: Chew   Tobacco comments:    Vape bottle lasts 1 month as of 12/28/22  Vaping Use   Vaping status: Every Day   Substances: Nicotine-salt  Substance and Sexual Activity   Alcohol use: Not Currently    Comment: Previously half of fifth of vodka 4 out of 7 nights per week none in the last 5 years   Drug use: Not Currently    Types: Cocaine, Marijuana, Psilocybin    Comment: Last used in 2017-2018   Sexual activity: Yes     Partners: Female

## 2023-03-22 NOTE — Progress Notes (Signed)
Only saw 1-1/2 days of relief Left is worse than right

## 2023-03-26 DIAGNOSIS — M25674 Stiffness of right foot, not elsewhere classified: Secondary | ICD-10-CM | POA: Diagnosis not present

## 2023-03-26 DIAGNOSIS — M25675 Stiffness of left foot, not elsewhere classified: Secondary | ICD-10-CM | POA: Diagnosis not present

## 2023-03-26 DIAGNOSIS — M25572 Pain in left ankle and joints of left foot: Secondary | ICD-10-CM | POA: Diagnosis not present

## 2023-03-26 DIAGNOSIS — M25571 Pain in right ankle and joints of right foot: Secondary | ICD-10-CM | POA: Diagnosis not present

## 2023-03-29 DIAGNOSIS — L0100 Impetigo, unspecified: Secondary | ICD-10-CM | POA: Diagnosis not present

## 2023-03-29 DIAGNOSIS — L0212 Furuncle of neck: Secondary | ICD-10-CM | POA: Diagnosis not present

## 2023-03-29 DIAGNOSIS — B9689 Other specified bacterial agents as the cause of diseases classified elsewhere: Secondary | ICD-10-CM | POA: Diagnosis not present

## 2023-03-29 DIAGNOSIS — D225 Melanocytic nevi of trunk: Secondary | ICD-10-CM | POA: Diagnosis not present

## 2023-04-02 DIAGNOSIS — M25571 Pain in right ankle and joints of right foot: Secondary | ICD-10-CM | POA: Diagnosis not present

## 2023-04-02 DIAGNOSIS — M25572 Pain in left ankle and joints of left foot: Secondary | ICD-10-CM | POA: Diagnosis not present

## 2023-04-02 DIAGNOSIS — M25674 Stiffness of right foot, not elsewhere classified: Secondary | ICD-10-CM | POA: Diagnosis not present

## 2023-04-02 DIAGNOSIS — M25675 Stiffness of left foot, not elsewhere classified: Secondary | ICD-10-CM | POA: Diagnosis not present

## 2023-04-05 ENCOUNTER — Ambulatory Visit (INDEPENDENT_AMBULATORY_CARE_PROVIDER_SITE_OTHER): Payer: BC Managed Care – PPO | Admitting: Clinical

## 2023-04-05 DIAGNOSIS — F410 Panic disorder [episodic paroxysmal anxiety] without agoraphobia: Secondary | ICD-10-CM

## 2023-04-05 DIAGNOSIS — F4310 Post-traumatic stress disorder, unspecified: Secondary | ICD-10-CM

## 2023-04-05 DIAGNOSIS — F411 Generalized anxiety disorder: Secondary | ICD-10-CM

## 2023-04-05 DIAGNOSIS — F331 Major depressive disorder, recurrent, moderate: Secondary | ICD-10-CM

## 2023-04-05 NOTE — Progress Notes (Signed)
IN PERSON   I connected with Jeffrey Hoover on 04/05/23 at 10:00 AM EDT in person and verified that I am speaking with the correct person using two identifiers.   Location: Patient: Office Provider: Office   I discussed the limitations of evaluation and management by telemedicine and the availability of in person appointments. The patient expressed understanding and agreed to proceed. ( IN PERSON)     THERAPIST PROGRESS NOTE   Session Time: 10:00 AM-10:45 AM   Participation Level: Active   Behavioral Response: CasualAlertAnxious   Type of Therapy: Individual Therapy   Treatment Goals addressed: Coping for Anxiety/PTSD/Depression   Interventions: CBT, Motivational Interviewing, Solution Focused and Supportive   Summary: [name] is a 37 y.o. male who presents with MDD/GAD/PTSD . The OPT therapist worked with the patient for his scheduled session. The OPT therapist utilized Motivational Interviewing to assist in creating therapeutic repore. The patient spoke about his interactions with his  partner in the home and the conflict as of recent. The patient notes that he spoke with Dr. Adrian Blackwater and placed emphasis that he feels actually his mental health problems stem from ADHD and Dr. Adrian Blackwater gave the patient information around having a formal test for ADHD. The patient spoke about upcoming business trip to [place] in November. The patient spoke about working in the home doing in home projects, but still is having difficulty focusing and completing task. The patient spoke about his med management and looking to make a change. The OPT therapist overviewed patient basic need areas including sleep cycle, eating habits, hygiene, and physical exercise. The OPT therapist overviewed coping strategies to further assist the patient in management of his MH symptoms. The OPT therapist over-viewed with the patient upcoming appointments as listed in his MyChart.    Suicidal/Homicidal: Nowithout  intent/plan   Therapist Response: The OPT therapist worked with the patient for the patients scheduled session. The patient was engaged in his session and gave feedback in relation to triggers, symptoms, and behavior responses over the past few weeks. The OPT therapist worked with the patient utilizing an in session Cognitive Behavioral Therapy exercise. The patient was responsive in the session and verbalized," Things at home got to a point after fussing for like 3 days we said we were done, but I have 2 months to get my mental health figured out or that's really going to be it for [place] and we will spilt and go are separate ways" The OPT therapist worked with the patient over-viewing implementation of coping strategies. The patient verbalized his intent to connect with the [facility] based out of [place], [place] to get formal ADHD testing completed.  The OPT therapist worked with the patient overviewing appointments listed in the patients MyChart.  The OPT therapist will continue treatment work with the patient in his next scheduled session.     Plan: Return again in 2/3 weeks.   Diagnosis:      Axis I: GAD/PTSD/MDD                           Axis II: No diagnosis     Collaboration of Care: Overview of patient involvement in the med therapy program with Dr. Adrian Blackwater.   Patient/Guardian was advised Release of Information must be obtained prior to any record release in order to collaborate their care with an outside provider. Patient/Guardian was advised if they have not already done so to contact the registration department  to sign all necessary forms in order for [place] to release information regarding their care.    Consent: Patient/Guardian gives verbal consent for treatment and assignment of benefits for services provided during this visit. Patient/Guardian expressed understanding and agreed to proceed.    I discussed the assessment and treatment plan with the patient. The patient was  provided an opportunity to ask questions and all were answered. The patient agreed with the plan and demonstrated an understanding of the instructions.   The patient was advised to call back or seek an in-person evaluation if the symptoms worsen or if the condition fails to improve as anticipated.   I provided 45 minutes of face-to-face time during this encounter.   Winfred Burn, LCSW    04/05/2023

## 2023-04-10 ENCOUNTER — Telehealth (HOSPITAL_COMMUNITY): Payer: BC Managed Care – PPO | Admitting: Psychiatry

## 2023-04-10 ENCOUNTER — Encounter: Payer: Self-pay | Admitting: Sports Medicine

## 2023-04-10 ENCOUNTER — Encounter (HOSPITAL_COMMUNITY): Payer: Self-pay | Admitting: Psychiatry

## 2023-04-10 ENCOUNTER — Ambulatory Visit: Payer: BC Managed Care – PPO | Admitting: Sports Medicine

## 2023-04-10 DIAGNOSIS — F332 Major depressive disorder, recurrent severe without psychotic features: Secondary | ICD-10-CM | POA: Diagnosis not present

## 2023-04-10 DIAGNOSIS — M9261 Juvenile osteochondrosis of tarsus, right ankle: Secondary | ICD-10-CM | POA: Diagnosis not present

## 2023-04-10 DIAGNOSIS — M6788 Other specified disorders of synovium and tendon, other site: Secondary | ICD-10-CM

## 2023-04-10 DIAGNOSIS — F4310 Post-traumatic stress disorder, unspecified: Secondary | ICD-10-CM | POA: Diagnosis not present

## 2023-04-10 DIAGNOSIS — R41840 Attention and concentration deficit: Secondary | ICD-10-CM | POA: Insufficient documentation

## 2023-04-10 DIAGNOSIS — F411 Generalized anxiety disorder: Secondary | ICD-10-CM

## 2023-04-10 DIAGNOSIS — F909 Attention-deficit hyperactivity disorder, unspecified type: Secondary | ICD-10-CM | POA: Insufficient documentation

## 2023-04-10 DIAGNOSIS — F410 Panic disorder [episodic paroxysmal anxiety] without agoraphobia: Secondary | ICD-10-CM

## 2023-04-10 DIAGNOSIS — F331 Major depressive disorder, recurrent, moderate: Secondary | ICD-10-CM

## 2023-04-10 DIAGNOSIS — I10 Essential (primary) hypertension: Secondary | ICD-10-CM

## 2023-04-10 DIAGNOSIS — M9262 Juvenile osteochondrosis of tarsus, left ankle: Secondary | ICD-10-CM

## 2023-04-10 DIAGNOSIS — M7752 Other enthesopathy of left foot: Secondary | ICD-10-CM | POA: Diagnosis not present

## 2023-04-10 DIAGNOSIS — M7751 Other enthesopathy of right foot: Secondary | ICD-10-CM

## 2023-04-10 DIAGNOSIS — G8929 Other chronic pain: Secondary | ICD-10-CM

## 2023-04-10 DIAGNOSIS — F17290 Nicotine dependence, other tobacco product, uncomplicated: Secondary | ICD-10-CM

## 2023-04-10 MED ORDER — CLONIDINE HCL 0.1 MG PO TABS
0.1000 mg | ORAL_TABLET | Freq: Every evening | ORAL | 1 refills | Status: AC
Start: 2023-04-10 — End: 2023-06-09

## 2023-04-10 MED ORDER — DULOXETINE HCL 60 MG PO CPEP
60.0000 mg | ORAL_CAPSULE | Freq: Every day | ORAL | 1 refills | Status: AC
Start: 2023-04-10 — End: 2023-06-09
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
## 2023-04-10 NOTE — Patient Instructions (Addendum)
We started clonidine 0.1 mg nightly today.  I would not expect the same robust response as you had with the Adderall but this should help slightly with some of the concentration deficit that you have been noticing but be on the lookout for changes with your blood pressure including lightheadedness when you first start this medication.  If it does not make you sleepy then you could switch to taking it in the morning.  As we discussed the most effective couple therapy is in the Gottman's method and you can find flash cards on the App Store and begin to look at the book "7 principles for making marriage work" by the [employer] while you are looking for a therapist that is in network.  I will have my front desk also reach out to [facility] to see with the delay as but after you get the testing done just swing by the clinic for the urine drug screen.

## 2023-04-10 NOTE — Progress Notes (Signed)
Doing well; states shockwave helps He also has a similar device he uses at home which helps

## 2023-04-10 NOTE — Progress Notes (Signed)
Jeffrey Hoover - 37 y.o. male MRN [id number]  Date of birth: 08-18-1985  Office Visit Note: Visit Date: 04/10/2023 PCP: Pcp, No Referred by: No ref. provider found  Subjective: Chief Complaint  Patient presents with   Left Heel - Pain   Right Heel - Pain   HPI: Jeffrey Hoover is a pleasant 37 y.o. male who presents today for bilateral heel pain with known Haglund deformity.  Known bilateral Haglund deformity, likely retrocalcaneal bursitis of both heels but MRI confirmed on the right.   We have done 2 trials of extracorporeal shockwave therapy - finding this beneficial.  His pain waxes and wanes but in general feels like he is greater than 50% improved.  Has much less pain with walking and everyday activity. Also seeing podiatry - they had discussion on trying PRP injection, considering this but would like to complete shockwave treatments first.  Pertinent ROS were reviewed with the patient and found to be negative unless otherwise specified above in HPI.   Assessment & Plan: Visit Diagnoses:  1. Haglund's deformity of both heels   2. Achilles tendonosis   3. Retrocalcaneal bursitis (back of heel), left   4. Retrocalcaneal bursitis (back of heel), right    Plan: Jeffrey Hoover has rather significant Haglund deformity with insertional Achilles tendinopathy.  He also is dealing with retrocalcaneal bursitis.  He has received good response from extracorporeal shockwave therapy.  He also currently is on a low-dose of prednisone.  He would like to repeat a few trials of extracorporeal shockwave therapy, proceeded with this today, patient tolerated well.  He may transition from formalized physical therapy to doing home stretching for the Achilles and heel.  He will follow-up next week for repeat evaluation and repeat shockwave.  Advised alternating diclofenac and indomethacin as they are both NSAIDs.  Follow-up: Return in about 1 week (around 04/17/2023) for for b/l heels (reg visit - EST).    Meds & Orders: No orders of the defined types were placed in this encounter.  No orders of the defined types were placed in this encounter.    Procedures: Procedure: ECSWT Indications:  Achilles tendinitis, Retrocalcaneal bursitis   Procedure Details Consent: Risks of procedure as well as the alternatives and risks of each were explained to the patient.  Verbal consent for procedure obtained. Time Out: Verified patient identification, verified procedure, site was marked, verified correct patient position. The area was cleaned with alcohol swab.     The right achilles and superior calcaneus was targeted for Extracorporeal shockwave therapy.    Preset: Achillodynia Power Level: 120 mJ Frequency: 14 Hz Impulse/cycles: 3000  Head size: Regular   Patient tolerated procedure well without immediate complications.   The left achilles and superior calcaneus was targeted for Extracorporeal shockwave therapy.    Preset: Achillodynia Power Level: 120 mJ Frequency: 14 Hz Impulse/cycles: 3000 Head size: Regular   Patient tolerated procedure well without immediate complications.       Clinical History: No specialty comments available.  He reports that he has been smoking cigarettes and e-cigarettes. He has quit using smokeless tobacco.  His smokeless tobacco use included chew.  Recent Labs    09/19/22 0800  HGBA1C 5.3    Objective:   Vital Signs: There were no vitals taken for this visit.  Physical Exam  Gen: Well-appearing, in no acute distress; non-toxic CV: Well-perfused. Warm.  Resp: Breathing unlabored on room air; no wheezing. Psych: Fluid speech in conversation; appropriate affect; normal thought process Neuro: Sensation  intact throughout. No gross coordination deficits.   Ortho Exam - Bilateral feet/ankles: There is significant bony bossing over the superior aspect of the calcaneus with tenderness to palpation bilaterally.  There is warmth and effusion of the  retrocalcaneal bursa bilaterally but left greater than right, no redness or warmth on right. There is notable xerosis of the skin over the heel.  Sensation to light touch intact.   Imaging: No results found.  Past Medical/Family/Surgical/Social History: Medications & Allergies reviewed per EMR, new medications updated. 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 No pertinent surgical history. Social History   Occupational History   Not on file  Tobacco Use   Smoking status: Every Day    Current packs/day: 0.00    Types: Cigarettes, E-cigarettes    Last attempt to quit: 2020    Years since quitting: 4.6   Smokeless tobacco: Former    Types: Chew   Tobacco comments:    Vape bottle lasts 1 month as of 12/28/22  Vaping Use   Vaping status: Every Day   Substances: Nicotine-salt  Substance and Sexual Activity   Alcohol use: Not Currently    Comment: Previously half of fifth of vodka 4 out of 7 nights per week none in the last 5 years   Drug use: Not Currently    Types: Cocaine,  Marijuana, Psilocybin    Comment: Last used in 2017-2018   Sexual activity: Yes    Partners: Female

## 2023-04-17 ENCOUNTER — Ambulatory Visit: Payer: BC Managed Care – PPO | Admitting: Sports Medicine

## 2023-04-17 ENCOUNTER — Encounter: Payer: Self-pay | Admitting: Sports Medicine

## 2023-04-17 DIAGNOSIS — M9261 Juvenile osteochondrosis of tarsus, right ankle: Secondary | ICD-10-CM | POA: Diagnosis not present

## 2023-04-17 DIAGNOSIS — M7751 Other enthesopathy of right foot: Secondary | ICD-10-CM | POA: Diagnosis not present

## 2023-04-17 DIAGNOSIS — M9262 Juvenile osteochondrosis of tarsus, left ankle: Secondary | ICD-10-CM

## 2023-04-17 DIAGNOSIS — M7752 Other enthesopathy of left foot: Secondary | ICD-10-CM | POA: Diagnosis not present

## 2023-04-17 DIAGNOSIS — M6788 Other specified disorders of synovium and tendon, other site: Secondary | ICD-10-CM

## 2023-04-17 NOTE — Patient Instructions (Addendum)
        Great to see you today.  I have refilled the medication(s) we provide.    If labs were collected, we will inform you of lab results once received either by echart message or telephone call.   - echart message- for normal results that have been seen by the patient already.   - telephone call: abnormal results or if patient has not viewed results in their echart.   - Please take medications as prescribed. - Follow up with your primary health provider if any health concerns arises. - If symptoms worsen please contact your primary care provider and/or visit the emergency department.      Here are some options to get the neuropsychiatric testing to have a definitive answer on if you have ADD or not: Eula Flax, NP at [facility] [phone]) or [facility] [phone]) or Ronney Asters at [facility]. [facility] of [facility] [phone] has multiple providers. [facility] [phone] has several providers specializing in ADHD/Bipolar= Andrena Mews, PhD is expert at Adult ADHD, Verna Czech, PhD treats adults with both diagnoses.

## 2023-04-17 NOTE — Progress Notes (Signed)
New Patient Office Visit   Subjective   Patient ID: Jeffrey Hoover, male    DOB: June 04, 1986  Age: 37 y.o. MRN: [id number]  CC:  Chief Complaint  Patient presents with   Establish Care    Pt. States his B/P has been high as well as pulse. He is seeing orthopedic for foot and back pain. Has injured his rt. Shoulder, and is seeing Behavorial Health for anxiety, depression and diagnosis.     HPI Jeffrey A Shelton37 year old male presents to establish care. He  has a past medical history of Chronic pain, Chronic suicidal ideation (09/08/2022), Depression, Generalized anxiety disorder with panic attacks, History of alcohol use disorder, Hypertension, and Tobacco use disorder.  Patient here for elevated blood pressure. He is exercising walks 1.3 miles a day and is not adherent to low salt diet.  Blood pressure is not well controlled at home. Patient denies cardiac symptoms chest pain, chest pressure/discomfort, dyspnea, palpitations, and tachypnea. Patient reports lower extremity edema.  Cardiovascular risk factors: hypertension, male gender, obesity (BMI >= 30 kg/m2), and smoking/ tobacco exposure.       Outpatient Encounter Medications as of 04/18/2023  Medication Sig   acetaminophen (TYLENOL) 500 MG tablet Take 500 mg by mouth every 8 (eight) hours as needed for moderate pain.   cloNIDine (CATAPRES) 0.1 MG tablet Take 1 tablet (0.1 mg total) by mouth at bedtime.   diclofenac (VOLTAREN) 75 MG EC tablet Take 1 tablet (75 mg total) by mouth 2 (two) times daily.   DULoxetine (CYMBALTA) 60 MG capsule Take 1 capsule (60 mg total) by mouth daily.   lisinopril (ZESTRIL) 5 MG tablet Take 1 tablet (5 mg total) by mouth daily.   predniSONE (DELTASONE) 10 MG tablet Take 1 tablet (10 mg total) by mouth 3 (three) times daily.   No facility-administered encounter medications on file as of 04/18/2023.    History reviewed. No pertinent surgical history.  Review of Systems  Constitutional:  Negative  for chills and fever.  Eyes:  Negative for blurred vision.  Respiratory:  Negative for shortness of breath.   Cardiovascular:  Negative for chest pain.  Gastrointestinal:  Negative for abdominal pain.  Neurological:  Negative for dizziness and headaches.      Objective    BP (!) 140/70 (BP Location: Left Arm, Patient Position: Sitting, Cuff Size: Large)   Pulse (!) 102   Ht 6\' 1"  (1.854 m)   Wt 294 lb 0.6 oz (133.4 kg)   SpO2 98%   BMI 38.79 kg/m   Physical Exam Vitals reviewed.  Constitutional:      General: He is not in acute distress.    Appearance: Normal appearance. He is not ill-appearing, toxic-appearing or diaphoretic.  HENT:     Head: Normocephalic.     Right Ear: Tympanic membrane normal.     Left Ear: Tympanic membrane normal.     Mouth/Throat:     Mouth: Mucous membranes are moist.  Eyes:     General:        Right eye: No discharge.        Left eye: No discharge.     Conjunctiva/sclera: Conjunctivae normal.     Pupils: Pupils are equal, round, and reactive to light.  Cardiovascular:     Rate and Rhythm: Normal rate.     Pulses: Normal pulses.     Heart sounds: Normal heart sounds.  Pulmonary:     Effort: Pulmonary effort is normal. No respiratory distress.  Breath sounds: Normal breath sounds.  Abdominal:     General: Bowel sounds are normal.     Palpations: Abdomen is soft.     Tenderness: There is no abdominal tenderness. There is no right CVA tenderness, left CVA tenderness or guarding.  Musculoskeletal:        General: Normal range of motion.     Cervical back: Normal range of motion.  Skin:    General: Skin is warm and dry.  Neurological:     Mental Status: He is alert.     Coordination: Coordination normal.     Gait: Gait normal.       Assessment & Plan:  Screening for diabetes mellitus -     Hemoglobin A1c  Need for hepatitis C screening test -     Hepatitis C antibody  Vitamin D deficiency -     VITAMIN D 25 Hydroxy (Vit-D  Deficiency, Fractures)  Vitamin B12 deficiency -     B12 and Folate Panel  Screening for HIV (human immunodeficiency virus) -     HIV Antibody (routine testing w rflx)  Screening for thyroid disorder -     TSH + free T4  Screening for lipid disorders -     Lipid panel  Primary hypertension Assessment & Plan: Vitals:   04/18/23 0848 04/18/23 0859  BP: (!) 159/95 (!) 140/70  Blood pressure not controlled in today's visit, Labs ordered today. Started Lisinopril 5 mg once daily Follow up in 4 weeks with at home blood pressure readings to monitor trends. Continued discussion on DASH diet, low sodium diet and maintain a exercise routine for 150 minutes per week.   Orders: -     CMP14+EGFR -     CBC with Differential/Platelet -     Lisinopril; Take 1 tablet (5 mg total) by mouth daily.  Dispense: 30 tablet; Refill: 2    Return in about 4 weeks (around 05/16/2023), or if symptoms worsen or fail to improve, for re-check blood pressure.   Cruzita Lederer Newman Nip, FNP

## 2023-04-17 NOTE — Progress Notes (Signed)
BAYAN PILAT - 37 y.o. male MRN [id number]  Date of birth: [birth year]  Office Visit Note: Visit Date: 04/17/2023 PCP: Pcp, No Referred by: No ref. provider found  Subjective: Chief Complaint  Patient presents with   Foot Pain    Patient states that shockwave therapy is helping with foot pain. He states that his right foot has not bothered him in a couple of days, but that the left foot is still painful. Patient states that he continues to do physical therapy home exercises.   HPI: ANTOINNE SAMARRIPA is a pleasant 37 y.o. male who presents today for bilateral heel pain with known Haglund deformity.   Known bilateral Haglund deformity, likely retrocalcaneal bursitis of both heels but MRI confirmed on the right.    We have done 3 trials of extracorporeal shockwave therapy - finding this beneficial.  His right foot has not given him any pain for the last few days, his left foot continues to be more painful.  He is doing home exercises.  He is planning to have an MRI of the left foot/ankle performed by podiatry, has not had this ordered yet but states had referral placed.  He is continuing on prednisone 10 mg twice daily, has about 5 days left.  Pertinent ROS were reviewed with the patient and found to be negative unless otherwise specified above in HPI.   Assessment & Plan: Visit Diagnoses:  1. Haglund's deformity of both heels   2. Achilles tendonosis   3. Retrocalcaneal bursitis (back of heel), left   4. Retrocalcaneal bursitis (back of heel), right     Plan: [name] is dealing with bilateral Haglund deformity with insertional Achilles tendinopathy and likely retrocalcaneal bursitis of both heels.  He has received good response from extracorporeal shockwave therapy, his right heel is no longer bothering him much.  Still has some pain over the left heel, does have an MRI by podiatry that is planning to be ordered.  We did repeat ESWT today, patient tolerated well.  He will continue his  home stretching and strengthening for the Achilles and the heel.  We will plan on somewhere between 5-7 sessions of extracorporeal shockwave therapy and then take at least a 1 month to 6-week holiday to see if this cumulative benefit. Did discuss stopping his indomethacin today to reduce GI side effects. F/u in about 1 week for repeat ESWT and re-evaluation.  Follow-up: Return in about 8 days (around 04/25/2023) for b/l heels .   Meds & Orders: No orders of the defined types were placed in this encounter.  No orders of the defined types were placed in this encounter.    Procedures: Procedure: ECSWT Indications:  Achilles tendinitis, Retrocalcaneal bursitis   Procedure Details Consent: Risks of procedure as well as the alternatives and risks of each were explained to the patient.  Verbal consent for procedure obtained. Time Out: Verified patient identification, verified procedure, site was marked, verified correct patient position. The area was cleaned with alcohol swab.     The right achilles and superior calcaneus was targeted for Extracorporeal shockwave therapy.    Preset: Achillodynia Power Level: 120 mJ Frequency: 14 Hz Impulse/cycles: 3000  Head size: Regular   Patient tolerated procedure well without immediate complications.   The left achilles and superior calcaneus was targeted for Extracorporeal shockwave therapy.    Preset: Achillodynia Power Level: 120 mJ Frequency: 14 Hz Impulse/cycles: 3000 Head size: Regular   Patient tolerated procedure well without immediate complications.  Clinical History: No specialty comments available.  He reports that he has been smoking cigarettes and e-cigarettes. He has quit using smokeless tobacco.  His smokeless tobacco use included chew.  Recent Labs    09/19/22 0800  HGBA1C 5.3    Objective:   Vital Signs: There were no vitals taken for this visit.  Physical Exam  Gen: Well-appearing, in no acute distress;  non-toxic CV: Well-perfused. Warm.  Resp: Breathing unlabored on room air; no wheezing. Psych: Fluid speech in conversation; appropriate affect; normal thought process Neuro: Sensation intact throughout. No gross coordination deficits.   Ortho Exam - Bilateral feet/ankles: There is significant bony bossing over the superior aspect of the calcaneus with tenderness to palpation bilaterally.  There is more prominence of the retrocalcaneal bursa bilaterally but left greater than right, no redness or warmth on right. There is notable xerosis of the skin over the heel.  Sensation to light touch intact.   Imaging: No results found.  Past Medical/Family/Surgical/Social History: Medications & Allergies reviewed per EMR, new medications updated. Patient Active Problem List   Diagnosis Date Noted   Inattention rule out ADHD 04/10/2023   PTSD (post-traumatic stress disorder) 09/08/2022   Major depressive disorder, recurrent severe without psychotic features (HCC) 09/08/2022   Chronic pain 09/08/2022   Vaping nicotine dependence, tobacco product 09/08/2022   History of alcohol use disorder 09/08/2022   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   Cannabis use disorder, moderate, in sustained remission (HCC) 09/08/2022   Does not have primary care provider 09/08/2022   Uncontrolled hypertension 09/08/2022   Generalized anxiety disorder with panic attacks 05/14/2017   Past Medical History:  Diagnosis Date   Chronic pain    Chronic suicidal ideation 09/08/2022   Depression    Generalized anxiety disorder with panic attacks    History of alcohol use disorder    Hypertension    Tobacco use disorder    Family History  Problem Relation Age of Onset   Anxiety disorder Mother    Depression Mother    No past surgical history on file. Social History   Occupational History   Not on file  Tobacco Use   Smoking status: Every Day    Current packs/day: 0.00    Types: Cigarettes,  E-cigarettes    Last attempt to quit: 2020    Years since quitting: 4.6   Smokeless tobacco: Former    Types: Chew   Tobacco comments:    Vape bottle lasts 1 month as of 12/28/22  Vaping Use   Vaping status: Every Day   Substances: Nicotine-salt  Substance and Sexual Activity   Alcohol use: Not Currently    Comment: Previously half of fifth of vodka 4 out of 7 nights per week none in the last 5 years   Drug use: Not Currently    Types: Cocaine, Marijuana, Psilocybin    Comment: Last used in 2017-2018   Sexual activity: Yes    Partners: Female

## 2023-04-18 ENCOUNTER — Encounter: Payer: Self-pay | Admitting: Family Medicine

## 2023-04-18 ENCOUNTER — Encounter: Payer: Self-pay | Admitting: Sports Medicine

## 2023-04-18 ENCOUNTER — Ambulatory Visit: Payer: BC Managed Care – PPO | Admitting: Family Medicine

## 2023-04-18 VITALS — BP 140/70 | HR 102 | Ht 73.0 in | Wt 294.0 lb

## 2023-04-18 DIAGNOSIS — Z1159 Encounter for screening for other viral diseases: Secondary | ICD-10-CM

## 2023-04-18 DIAGNOSIS — I10 Essential (primary) hypertension: Secondary | ICD-10-CM | POA: Diagnosis not present

## 2023-04-18 DIAGNOSIS — Z1329 Encounter for screening for other suspected endocrine disorder: Secondary | ICD-10-CM

## 2023-04-18 DIAGNOSIS — E538 Deficiency of other specified B group vitamins: Secondary | ICD-10-CM

## 2023-04-18 DIAGNOSIS — E559 Vitamin D deficiency, unspecified: Secondary | ICD-10-CM | POA: Diagnosis not present

## 2023-04-18 DIAGNOSIS — Z131 Encounter for screening for diabetes mellitus: Secondary | ICD-10-CM

## 2023-04-18 DIAGNOSIS — Z114 Encounter for screening for human immunodeficiency virus [HIV]: Secondary | ICD-10-CM

## 2023-04-18 DIAGNOSIS — Z13220 Encounter for screening for lipoid disorders: Secondary | ICD-10-CM

## 2023-04-18 MED ORDER — LISINOPRIL 5 MG PO TABS
5.0000 mg | ORAL_TABLET | Freq: Every day | ORAL | 2 refills | Status: DC
Start: 2023-04-18 — End: 2023-08-06

## 2023-04-18 NOTE — Assessment & Plan Note (Signed)
Vitals:   04/18/23 0848 04/18/23 0859  BP: (!) 159/95 (!) 140/70  Blood pressure not controlled in today's visit, Labs ordered today. Started Lisinopril 5 mg once daily Follow up in 4 weeks with at home blood pressure readings to monitor trends. Continued discussion on DASH diet, low sodium diet and maintain a exercise routine for 150 minutes per week.

## 2023-04-19 DIAGNOSIS — L0100 Impetigo, unspecified: Secondary | ICD-10-CM | POA: Diagnosis not present

## 2023-04-25 ENCOUNTER — Ambulatory Visit: Payer: BC Managed Care – PPO | Admitting: Sports Medicine

## 2023-04-25 DIAGNOSIS — M79671 Pain in right foot: Secondary | ICD-10-CM | POA: Diagnosis not present

## 2023-04-25 DIAGNOSIS — M9261 Juvenile osteochondrosis of tarsus, right ankle: Secondary | ICD-10-CM | POA: Diagnosis not present

## 2023-04-25 DIAGNOSIS — M9262 Juvenile osteochondrosis of tarsus, left ankle: Secondary | ICD-10-CM

## 2023-04-25 DIAGNOSIS — M7752 Other enthesopathy of left foot: Secondary | ICD-10-CM | POA: Diagnosis not present

## 2023-04-25 DIAGNOSIS — M79672 Pain in left foot: Secondary | ICD-10-CM

## 2023-04-25 DIAGNOSIS — M7751 Other enthesopathy of right foot: Secondary | ICD-10-CM

## 2023-04-25 DIAGNOSIS — M6788 Other specified disorders of synovium and tendon, other site: Secondary | ICD-10-CM

## 2023-04-25 NOTE — Progress Notes (Signed)
Patient states that something "pulled loose" in his left foot which caused a lot of pain for a few days but seems to have made it better by today. He says that he is at a 1 or 2 out of 10 for pain in his left foot, and feels no pain in his right foot.

## 2023-04-25 NOTE — Progress Notes (Signed)
Jeffrey Hoover - 37 y.o. male MRN [id number]  Date of birth: Jun 10, 1986  Office Visit Note: Visit Date: 04/25/2023 PCP: Rica Records, FNP Referred by: No ref. provider found  Subjective: Chief Complaint  Patient presents with   Left Heel - Follow-up, Pain   Right Heel - Follow-up, Pain   HPI: Jeffrey Hoover is a pleasant 37 y.o. male who presents today for bilateral heel pain with known Haglund deformity.   Known bilateral Haglund deformity, likely retrocalcaneal bursitis of both heels but MRI confirmed on the right. We have performed 4 sessions of extracorporeal shockwave therapy so far, he feels like he is making good progress with this.  His right heel and Achilles essentially has no pain whatsoever.  Last week he was walking down a step and when he planted with the left foot he felt a sharp pain at the base of the Achilles and heel that shot of the leg.  He had quite a bit of soreness reduction made him nauseous for a few days but here over the last couple days his pain has largely resolved and now the left heel and Achilles feels better than it did previously from last shockwave.  He is working on reducing his oral diclofenac use, takes 75 mg once daily, occasionally twice.  Pertinent ROS were reviewed with the patient and found to be negative unless otherwise specified above in HPI.   Assessment & Plan: Visit Diagnoses:  1. Heel pain, bilateral   2. Haglund's deformity of both heels   3. Achilles tendonosis   4. Retrocalcaneal bursitis (back of heel), left   5. Retrocalcaneal bursitis (back of heel), right    Plan: Jeffrey Hoover is dealing with bilateral heel pain with notable Haglund deformity with insertional Achilles tendinopathy and bilateral retrocalcaneal bursitis.  He had a recent exacerbation of the left heel and Achilles last week but this is starting to resolve and feel better.  He has made good improvements with extracorporeal shockwave therapy for both sides,  his right pain essentially has no pain anymore.  He is having difficulty obtaining MRI of the left ankle/heel with podiatry, asking if we can order this today, referral was sent.  This will better evaluate the quality of the Achilles tendon as well as the degree of retrocalcaneal bursitis and underlying pathology.  We will wait this MRI and have him follow-up in 1 week after this to review, discuss next steps and consideration of 1-2 more extracorporeal shockwave therapies if he continues finding benefit.  He will discontinue indomethacin, and we will work on transitioning off of the oral diclofenac 75mg  as his pain allows to prevent against GI distress and other SE's of long-term use.  Follow-up: Return for F/u 1-week for b/l heels after MRI review (30-mins for MRI review and shockwave).   Meds & Orders: No orders of the defined types were placed in this encounter.   Orders Placed This Encounter  Procedures   MR Ankle Left w/o contrast     Procedures: Procedure: ECSWT Indications:  Achilles tendinitis, Retrocalcaneal bursitis   Procedure Details Consent: Risks of procedure as well as the alternatives and risks of each were explained to the patient.  Verbal consent for procedure obtained. Time Out: Verified patient identification, verified procedure, site was marked, verified correct patient position. The area was cleaned with alcohol swab.     The right achilles and superior calcaneus was targeted for Extracorporeal shockwave therapy.    Preset: Achillodynia Power Level: 120  mJ Frequency: 14 Hz Impulse/cycles: 3000  Head size: Regular   Patient tolerated procedure well without immediate complications.   The left achilles and superior calcaneus was targeted for Extracorporeal shockwave therapy.    Preset: Achillodynia Power Level: 120 mJ Frequency: 14 Hz Impulse/cycles: 3400 Head size: Regular   Patient tolerated procedure well without immediate complications.         Clinical  History: No specialty comments available.  He reports that he has been smoking cigarettes and e-cigarettes. He has quit using smokeless tobacco.  His smokeless tobacco use included chew.  Recent Labs    09/19/22 0800  HGBA1C 5.3    Objective:    Physical Exam  Gen: Well-appearing, in no acute distress; non-toxic CV: Well-perfused. Warm.  Resp: Breathing unlabored on room air; no wheezing. Psych: Fluid speech in conversation; appropriate affect; normal thought process Neuro: Sensation intact throughout. No gross coordination deficits.   Ortho Exam - Bilateral feet: There is significant bony bossing of the superior aspect of the calcaneus with notable Haglund deformity.  The right has no tenderness to palpation, there is some tenderness with deep palpation over the superior aspect of the left calcaneus and retrocalcaneal bursa.  There does appear to be retrocalcaneal bursitis of both but his left is worse than right.  No significant warmth or redness.  Imaging:  MR ANKLE RIGHT WO CONTRAST CLINICAL DATA:  Achilles tendinitis, chronic pain and swelling  EXAM: MRI OF THE RIGHT ANKLE WITHOUT CONTRAST  TECHNIQUE: Multiplanar, multisequence MR imaging of the ankle was performed. No intravenous contrast was administered.  COMPARISON:  None Available.  FINDINGS: TENDONS  Peroneal: Moderate tendinosis of the peroneus longus with a small interstitial tear at the level of the lateral malleolus. Mild peroneal tenosynovitis. Peroneal brevis intact.  Posteromedial: Posterior tibial tendon intact. Flexor hallucis longus tendon intact. Flexor digitorum longus tendon intact. Mild tenosynovitis of the posterior tibial tendon.  Anterior: Tibialis anterior tendon intact. Extensor hallucis longus tendon intact Extensor digitorum longus tendon intact.  Achilles: Moderate tendinosis of the Achilles tendon with subcortical reactive marrow edema at the calcaneal insertion.  Severe retrocalcaneal bursitis.  Plantar Fascia: Intact.  LIGAMENTS  Lateral: Anterior talofibular ligament intact. Calcaneofibular ligament intact. Posterior talofibular ligament intact. Anterior and posterior tibiofibular ligaments intact.  Medial: Deltoid ligament intact. Spring ligament intact.  CARTILAGE  Ankle Joint: No joint effusion. Normal ankle mortise. No chondral defect.  Subtalar Joints/Sinus Tarsi: Normal subtalar joints. No subtalar joint effusion. Normal sinus tarsi.  Bones: No aggressive osseous lesion. No fracture or dislocation.  Soft Tissue: No fluid collection or hematoma. Muscles are normal without edema or atrophy. Tarsal tunnel is normal.  IMPRESSION: 1. Moderate tendinosis of the Achilles tendon with subcortical reactive marrow edema at the calcaneal insertion. Severe retrocalcaneal bursitis. 2. Moderate tendinosis of the peroneus longus with a small interstitial tear at the level of the lateral malleolus. Mild peroneal tenosynovitis. 3. Mild tenosynovitis of the posterior tibial tendon.  Electronically Signed   By: Elige Ko M.D.   On: 02/18/2023 06:54    Narrative & Impression  X-ray left ankle chronic pain and swelling with posterior pain and enlargement/mass posterior aspect of the calcaneus   The patient has what would be considered a fairly high arch, he has large osteophytes and calcification as well as prominence of the superior angle of the calcaneus in and around the Achilles tendon   Impression High arch Haglund's type calcaneal prominence Calcification in the Achilles tendon    Narrative & Impression  X-ray right ankle   Chronic posterior ankle pain with swelling around the ankle   The ankle joint itself is normal.  As we saw on the left side, ankle x-ray did also show as this 1 does a prominence of the superior portion of the calcaneus with Haglund's type IV deformity there is an osteophyte at at the Achilles insertion  with some calcification in the tendon   Impression   Calcification of the Achilles tendon insertion Osteophyte calcaneus Haglund's type superior deformity    Past Medical/Family/Surgical/Social History: Medications & Allergies reviewed per EMR, new medications updated. Patient Active Problem List   Diagnosis Date Noted   Inattention rule out ADHD 04/10/2023   PTSD (post-traumatic stress disorder) 09/08/2022   Major depressive disorder, recurrent severe without psychotic features (HCC) 09/08/2022   Chronic pain 09/08/2022   Vaping nicotine dependence, tobacco product 09/08/2022   History of alcohol use disorder 09/08/2022   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   Cannabis use disorder, moderate, in sustained remission (HCC) 09/08/2022   Does not have primary care provider 09/08/2022   Hypertension 09/08/2022   Generalized anxiety disorder with panic attacks 05/14/2017   Past Medical History:  Diagnosis Date   Chronic pain    Chronic suicidal ideation 09/08/2022   Depression    Generalized anxiety disorder with panic attacks    History of alcohol use disorder    Hypertension    Tobacco use disorder    Family History  Problem Relation Age of Onset   Anxiety disorder Mother    Depression Mother    No past surgical history on file. Social History   Occupational History   Not on file  Tobacco Use   Smoking status: Every Day    Current packs/day: 0.00    Types: Cigarettes, E-cigarettes    Last attempt to quit: 2020    Years since quitting: 4.6   Smokeless tobacco: Former    Types: Chew   Tobacco comments:    Vape bottle lasts 1 month as of 12/28/22  Vaping Use   Vaping status: Every Day   Substances: Nicotine-salt  Substance and Sexual Activity   Alcohol use: Not Currently    Comment: Previously half of fifth of vodka 4 out of 7 nights per week none in the last 5 years   Drug use: Not Currently    Types: Cocaine, Marijuana, Psilocybin    Comment:  Last used in 2017-2018   Sexual activity: Yes    Partners: Female

## 2023-05-07 ENCOUNTER — Ambulatory Visit: Payer: BC Managed Care – PPO | Admitting: Family Medicine

## 2023-05-07 ENCOUNTER — Encounter: Payer: Self-pay | Admitting: Family Medicine

## 2023-05-07 VITALS — BP 124/81 | HR 87 | Resp 16 | Ht 73.0 in | Wt 288.0 lb

## 2023-05-07 DIAGNOSIS — E66812 Obesity, class 2: Secondary | ICD-10-CM | POA: Insufficient documentation

## 2023-05-07 DIAGNOSIS — M7661 Achilles tendinitis, right leg: Secondary | ICD-10-CM | POA: Diagnosis not present

## 2023-05-07 DIAGNOSIS — E669 Obesity, unspecified: Secondary | ICD-10-CM | POA: Diagnosis not present

## 2023-05-07 DIAGNOSIS — I10 Essential (primary) hypertension: Secondary | ICD-10-CM | POA: Diagnosis not present

## 2023-05-07 DIAGNOSIS — M7662 Achilles tendinitis, left leg: Secondary | ICD-10-CM

## 2023-05-07 MED ORDER — MELOXICAM 7.5 MG PO TABS
7.5000 mg | ORAL_TABLET | Freq: Every day | ORAL | 1 refills | Status: DC
Start: 2023-05-07 — End: 2023-06-08

## 2023-05-07 MED ORDER — SEMAGLUTIDE-WEIGHT MANAGEMENT 0.25 MG/0.5ML ~~LOC~~ SOAJ
0.2500 mg | SUBCUTANEOUS | 0 refills | Status: DC
Start: 2023-05-07 — End: 2023-05-29

## 2023-05-07 NOTE — Assessment & Plan Note (Signed)
Trial on Wegovy 0.25 mg once weekly. Discussed the importance to start eating 3 meals a day including breakfast, drink 8 glasses of water a day ,reduce portion sizes. reduced carbohydrates limit saturated and trans fat, increase servings of vegetables and limit processed foods. Find an activity that you will enjoy and start to be active at least 5 days a week for 30 minutes each day. Keep a food journal or an activity journal to identify triggers that lead to emotional eating

## 2023-05-07 NOTE — Patient Instructions (Signed)
Great to see you today.  I have refilled the medication(s) we provide.    - Please take medications as prescribed. - Follow up with your primary health provider if any health concerns arises. - If symptoms worsen please contact your primary care provider and/or visit the emergency department.

## 2023-05-07 NOTE — Assessment & Plan Note (Signed)
Vitals:   05/07/23 1352  BP: 124/81  Blood pressure controlled in today's visit Continue Lisinopril 5 mg once daily Continued discussion on DASH diet, low sodium diet and maintain a exercise routine for 150 minutes per week.

## 2023-05-07 NOTE — Progress Notes (Signed)
Patient Office Visit   Subjective   Patient ID: Jeffrey Hoover, male    DOB: [birth year]  Age: 37 y.o. MRN: [id number]  CC:  Chief Complaint  Patient presents with   Weight Gain    Concern for weight gain from medications and struggling to lose weight    HPI Taiwo A [place] 37 year old male, presents to the clinic for HTN follow up. He  has a past medical history of Chronic pain, Chronic suicidal ideation (09/08/2022), Depression, Generalized anxiety disorder with panic attacks, History of alcohol use disorder, Hypertension, and Tobacco use disorder.For the details of today's visit, please refer to assessment and plan.   HPI    Outpatient Encounter Medications as of 05/07/2023  Medication Sig   acetaminophen (TYLENOL) 500 MG tablet Take 500 mg by mouth every 8 (eight) hours as needed for moderate pain.   cloNIDine (CATAPRES) 0.1 MG tablet Take 1 tablet (0.1 mg total) by mouth at bedtime.   DULoxetine (CYMBALTA) 60 MG capsule Take 1 capsule (60 mg total) by mouth daily.   lisinopril (ZESTRIL) 5 MG tablet Take 1 tablet (5 mg total) by mouth daily.   meloxicam (MOBIC) 7.5 MG tablet Take 1 tablet (7.5 mg total) by mouth daily.   predniSONE (DELTASONE) 10 MG tablet Take 1 tablet (10 mg total) by mouth 3 (three) times daily.   Semaglutide-Weight Management 0.25 MG/0.5ML SOAJ Inject 0.25 mg into the skin once a week for 28 days.   [DISCONTINUED] diclofenac (VOLTAREN) 75 MG EC tablet Take 1 tablet (75 mg total) by mouth 2 (two) times daily.   No facility-administered encounter medications on file as of 05/07/2023.    No past surgical history on file.  Review of Systems  Constitutional:  Negative for chills and fever.  Eyes:  Negative for blurred vision.  Respiratory:  Negative for shortness of breath.   Cardiovascular:  Negative for chest pain.  Genitourinary:  Negative for dysuria.  Musculoskeletal:  Positive for joint pain and myalgias.  Neurological:  Positive for  dizziness.      Objective    BP 124/81   Pulse 87   Resp 16   Ht 6\' 1"  (1.854 m)   Wt 288 lb (130.6 kg)   SpO2 99%   BMI 38.00 kg/m   Physical Exam Vitals reviewed.  Constitutional:      General: He is not in acute distress.    Appearance: Normal appearance. He is not ill-appearing, toxic-appearing or diaphoretic.  HENT:     Head: Normocephalic.  Eyes:     General:        Right eye: No discharge.        Left eye: No discharge.     Conjunctiva/sclera: Conjunctivae normal.  Cardiovascular:     Rate and Rhythm: Normal rate.     Pulses: Normal pulses.     Heart sounds: Normal heart sounds.  Pulmonary:     Effort: Pulmonary effort is normal. No respiratory distress.     Breath sounds: Normal breath sounds.  Musculoskeletal:        General: Normal range of motion.     Cervical back: Normal range of motion.  Skin:    General: Skin is warm and dry.     Capillary Refill: Capillary refill takes less than 2 seconds.  Neurological:     Mental Status: He is alert.     Coordination: Coordination normal.     Gait: Gait normal.  Psychiatric:  Mood and Affect: Mood normal.       Assessment & Plan:  Obesity (BMI 30-39.9) Assessment & Plan: Trial on Wegovy 0.25 mg once weekly. Discussed the importance to start eating 3 meals a day including breakfast, drink 8 glasses of water a day ,reduce portion sizes. reduced carbohydrates limit saturated and trans fat, increase servings of vegetables and limit processed foods. Find an activity that you will enjoy and start to be active at least 5 days a week for 30 minutes each day. Keep a food journal or an activity journal to identify triggers that lead to emotional eating   Orders: -     Semaglutide-Weight Management; Inject 0.25 mg into the skin once a week for 28 days.  Dispense: 2 mL; Refill: 0  Achilles tendinitis of both lower extremities -     Meloxicam; Take 1 tablet (7.5 mg total) by mouth daily.  Dispense: 30 tablet;  Refill: 1  Primary hypertension Assessment & Plan: Vitals:   05/07/23 1352  BP: 124/81  Blood pressure controlled in today's visit Continue Lisinopril 5 mg once daily Continued discussion on DASH diet, low sodium diet and maintain a exercise routine for 150 minutes per week.      Return in about 4 months (around 09/06/2023), or if symptoms worsen or fail to improve, for hypertension, Weight Loss Mangment.   Cruzita Lederer Newman Nip, FNP

## 2023-05-09 DIAGNOSIS — Z1159 Encounter for screening for other viral diseases: Secondary | ICD-10-CM | POA: Diagnosis not present

## 2023-05-09 DIAGNOSIS — E559 Vitamin D deficiency, unspecified: Secondary | ICD-10-CM | POA: Diagnosis not present

## 2023-05-09 DIAGNOSIS — Z114 Encounter for screening for human immunodeficiency virus [HIV]: Secondary | ICD-10-CM | POA: Diagnosis not present

## 2023-05-09 DIAGNOSIS — Z131 Encounter for screening for diabetes mellitus: Secondary | ICD-10-CM | POA: Diagnosis not present

## 2023-05-09 DIAGNOSIS — I10 Essential (primary) hypertension: Secondary | ICD-10-CM | POA: Diagnosis not present

## 2023-05-09 DIAGNOSIS — E538 Deficiency of other specified B group vitamins: Secondary | ICD-10-CM | POA: Diagnosis not present

## 2023-05-09 DIAGNOSIS — Z13220 Encounter for screening for lipoid disorders: Secondary | ICD-10-CM | POA: Diagnosis not present

## 2023-05-10 LAB — CBC WITH DIFFERENTIAL/PLATELET
Basophils Absolute: 0 10*3/uL (ref 0.0–0.2)
Basos: 1 %
EOS (ABSOLUTE): 0.2 10*3/uL (ref 0.0–0.4)
Eos: 3 %
Hematocrit: 39.6 % (ref 37.5–51.0)
Hemoglobin: 13.3 g/dL (ref 13.0–17.7)
Immature Grans (Abs): 0 10*3/uL (ref 0.0–0.1)
Immature Granulocytes: 0 %
Lymphocytes Absolute: 1.4 10*3/uL (ref 0.7–3.1)
Lymphs: 29 %
MCH: 30.2 pg (ref 26.6–33.0)
MCHC: 33.6 g/dL (ref 31.5–35.7)
MCV: 90 fL (ref 79–97)
Monocytes Absolute: 0.3 10*3/uL (ref 0.1–0.9)
Monocytes: 7 %
Neutrophils Absolute: 3 10*3/uL (ref 1.4–7.0)
Neutrophils: 60 %
Platelets: 208 10*3/uL (ref 150–450)
RBC: 4.41 x10E6/uL (ref 4.14–5.80)
RDW: 13 % (ref 11.6–15.4)
WBC: 5 10*3/uL (ref 3.4–10.8)

## 2023-05-10 LAB — HEMOGLOBIN A1C
Est. average glucose Bld gHb Est-mCnc: 100 mg/dL
Hgb A1c MFr Bld: 5.1 % (ref 4.8–5.6)

## 2023-05-10 LAB — CMP14+EGFR
ALT: 54 IU/L — ABNORMAL HIGH (ref 0–44)
AST: 35 IU/L (ref 0–40)
Albumin: 4.3 g/dL (ref 4.1–5.1)
Alkaline Phosphatase: 126 IU/L — ABNORMAL HIGH (ref 44–121)
BUN/Creatinine Ratio: 15 (ref 9–20)
BUN: 11 mg/dL (ref 6–20)
Bilirubin Total: 0.3 mg/dL (ref 0.0–1.2)
CO2: 25 mmol/L (ref 20–29)
Calcium: 9.3 mg/dL (ref 8.7–10.2)
Chloride: 105 mmol/L (ref 96–106)
Creatinine, Ser: 0.73 mg/dL — ABNORMAL LOW (ref 0.76–1.27)
Globulin, Total: 2.2 g/dL (ref 1.5–4.5)
Glucose: 95 mg/dL (ref 70–99)
Potassium: 4.3 mmol/L (ref 3.5–5.2)
Sodium: 142 mmol/L (ref 134–144)
Total Protein: 6.5 g/dL (ref 6.0–8.5)
eGFR: 121 mL/min/{1.73_m2} (ref >59)

## 2023-05-10 LAB — VITAMIN D 25 HYDROXY (VIT D DEFICIENCY, FRACTURES): Vit D, 25-Hydroxy: 29.9 ng/mL — ABNORMAL LOW (ref 30.0–100.0)

## 2023-05-10 LAB — HIV ANTIBODY (ROUTINE TESTING W REFLEX): HIV Screen 4th Generation wRfx: NONREACTIVE

## 2023-05-10 LAB — LIPID PANEL
Chol/HDL Ratio: 5 ratio (ref 0.0–5.0)
Cholesterol, Total: 160 mg/dL (ref 100–199)
HDL: 32 mg/dL — ABNORMAL LOW (ref >39)
LDL Chol Calc (NIH): 99 mg/dL (ref 0–99)
Triglycerides: 162 mg/dL — ABNORMAL HIGH (ref 0–149)
VLDL Cholesterol Cal: 29 mg/dL (ref 5–40)

## 2023-05-10 LAB — B12 AND FOLATE PANEL
Folate: 9.3 ng/mL (ref >3.0)
Vitamin B-12: 559 pg/mL (ref 232–1245)

## 2023-05-10 LAB — TSH+FREE T4
Free T4: 0.91 ng/dL (ref 0.82–1.77)
TSH: 0.776 u[IU]/mL (ref 0.450–4.500)

## 2023-05-10 LAB — HEPATITIS C ANTIBODY: Hep C Virus Ab: NONREACTIVE

## 2023-05-11 ENCOUNTER — Telehealth (INDEPENDENT_AMBULATORY_CARE_PROVIDER_SITE_OTHER): Payer: BC Managed Care – PPO | Admitting: Psychiatry

## 2023-05-11 ENCOUNTER — Encounter (HOSPITAL_COMMUNITY): Payer: Self-pay | Admitting: Psychiatry

## 2023-05-11 DIAGNOSIS — F4310 Post-traumatic stress disorder, unspecified: Secondary | ICD-10-CM

## 2023-05-11 DIAGNOSIS — F17290 Nicotine dependence, other tobacco product, uncomplicated: Secondary | ICD-10-CM

## 2023-05-11 DIAGNOSIS — R41840 Attention and concentration deficit: Secondary | ICD-10-CM | POA: Diagnosis not present

## 2023-05-11 DIAGNOSIS — F410 Panic disorder [episodic paroxysmal anxiety] without agoraphobia: Secondary | ICD-10-CM

## 2023-05-11 DIAGNOSIS — G8929 Other chronic pain: Secondary | ICD-10-CM

## 2023-05-11 DIAGNOSIS — F411 Generalized anxiety disorder: Secondary | ICD-10-CM | POA: Diagnosis not present

## 2023-05-11 DIAGNOSIS — F331 Major depressive disorder, recurrent, moderate: Secondary | ICD-10-CM | POA: Diagnosis not present

## 2023-05-11 MED ORDER — CLONIDINE HCL 0.2 MG PO TABS
0.2000 mg | ORAL_TABLET | Freq: Every evening | ORAL | 1 refills | Status: DC
Start: 2023-05-11 — End: 2023-06-08

## 2023-05-11 MED ORDER — DULOXETINE HCL 60 MG PO CPEP
60.0000 mg | ORAL_CAPSULE | Freq: Every day | ORAL | 1 refills | Status: DC
Start: 2023-05-11 — End: 2023-06-29
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
## 2023-05-11 NOTE — Patient Instructions (Signed)
We increased the clonidine to 0.2 mg nightly today.  We otherwise kept the Cymbalta the same.  As soon as you can please complete and send back the [facility] packet in order to have your ADHD testing done.

## 2023-05-16 ENCOUNTER — Ambulatory Visit: Payer: BC Managed Care – PPO | Admitting: Family Medicine

## 2023-05-16 ENCOUNTER — Telehealth: Payer: Self-pay | Admitting: Family Medicine

## 2023-05-16 NOTE — Telephone Encounter (Signed)
Patient called said this appointment was supposed to be canceled  when the 01.23.2025 was scheduled, provider address blood pressure on 09.23.2024. patient asked can the no show fee be waived for 10.02.2024.  He came in from work at 4:00 am and said his little one came in his room and turned his alarm clock off to come in this morning but after looking he was not suppose be on the schedule for today.

## 2023-05-17 ENCOUNTER — Ambulatory Visit (HOSPITAL_COMMUNITY): Payer: BC Managed Care – PPO | Admitting: Clinical

## 2023-05-17 DIAGNOSIS — F331 Major depressive disorder, recurrent, moderate: Secondary | ICD-10-CM

## 2023-05-17 DIAGNOSIS — F410 Panic disorder [episodic paroxysmal anxiety] without agoraphobia: Secondary | ICD-10-CM

## 2023-05-17 DIAGNOSIS — F411 Generalized anxiety disorder: Secondary | ICD-10-CM

## 2023-05-17 DIAGNOSIS — F4310 Post-traumatic stress disorder, unspecified: Secondary | ICD-10-CM
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
## 2023-05-21 ENCOUNTER — Ambulatory Visit: Payer: BC Managed Care – PPO | Admitting: Podiatry

## 2023-05-21 DIAGNOSIS — S86012A Strain of left Achilles tendon, initial encounter: Secondary | ICD-10-CM

## 2023-05-21 DIAGNOSIS — M6788 Other specified disorders of synovium and tendon, other site: Secondary | ICD-10-CM | POA: Diagnosis not present

## 2023-05-21 DIAGNOSIS — M7732 Calcaneal spur, left foot: Secondary | ICD-10-CM

## 2023-05-21 NOTE — Progress Notes (Signed)
Subjective:  Patient ID: Jeffrey Hoover, male    DOB: Jun 24, 1986,  MRN: [id number]  Chief Complaint  Patient presents with   Foot Pain    R foot is not hurting near as much.  L foot is now hurting more constantly.  Looking to see if an MRI can be done at [facility] due to ins.  Using heel wedges in shoes. Last shock wave therapy was 05/03/23.  On 9/11, stepped of stair and felt a cold sensation as if dislocated.  Pain is 8.  Taking pain meds but not working as is not taking unless have to.  Does not feel they are working. 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    37 y.o. returns for follow-up with the above complaint. History confirmed with patient.  Right side is doing much better now and is becoming less painful.  He felt like something popped or pulled on the left side recently on 04/25/2023.  An MRI has been ordered but they are working on getting rescheduled at [facility] due to insurance issues . Objective:  Physical Exam: warm, good capillary refill, no trophic changes or ulcerative lesions, normal DP and PT pulses, normal sensory exam, and left heel pain on palpation to the distal insertion as well as the mid substance, right is not tender today  Radiographs: Previous right ankle radiographs taken 06/13/2022 show bilateral retrocalcaneal spurring with Haglund deformity  Had an MRI completed 02/10/2023 shows Study Result  Narrative & Impression  CLINICAL DATA:  Achilles tendinitis, chronic pain and swelling   EXAM: MRI OF THE RIGHT ANKLE WITHOUT CONTRAST   TECHNIQUE: Multiplanar, multisequence MR imaging of the ankle was performed. No intravenous contrast was administered.   COMPARISON:  None Available.   FINDINGS: TENDONS   Peroneal: Moderate tendinosis of the peroneus longus with a small interstitial tear at the level of the lateral malleolus. Mild peroneal tenosynovitis. Peroneal brevis intact.   Posteromedial: Posterior tibial tendon intact.  Flexor hallucis longus tendon intact. Flexor digitorum longus tendon intact. Mild tenosynovitis of the posterior tibial tendon.   Anterior: Tibialis anterior tendon intact. Extensor hallucis longus tendon intact Extensor digitorum longus tendon intact.   Achilles: Moderate tendinosis of the Achilles tendon with subcortical reactive marrow edema at the calcaneal insertion. Severe retrocalcaneal bursitis.   Plantar Fascia: Intact.   LIGAMENTS   Lateral: Anterior talofibular ligament intact. Calcaneofibular ligament intact. Posterior talofibular ligament intact. Anterior and posterior tibiofibular ligaments intact.   Medial: Deltoid ligament intact. Spring ligament intact.   CARTILAGE   Ankle Joint: No joint effusion. Normal ankle mortise. No chondral defect.   Subtalar Joints/Sinus Tarsi: Normal subtalar joints. No subtalar joint effusion. Normal sinus tarsi.   Bones: No aggressive osseous lesion. No fracture or dislocation.   Soft Tissue: No fluid collection or hematoma. Muscles are normal without edema or atrophy. Tarsal tunnel is normal.   IMPRESSION: 1. Moderate tendinosis of the Achilles tendon with subcortical reactive marrow edema at the calcaneal insertion. Severe retrocalcaneal bursitis. 2. Moderate tendinosis of the peroneus longus with a small interstitial tear at the level of the lateral malleolus. Mild peroneal tenosynovitis. 3. Mild tenosynovitis of the posterior tibial tendon.     Electronically Signed   By: Elige Ko M.D.   On: 02/18/2023 06:54   Assessment:   1. Achilles tendinosis of both lower extremities   2. Bone spur of posterior portion of left calcaneus   3. Partial tear of left Achilles tendon, initial encounter  Plan:  Patient was evaluated and treated and all questions answered.  He returns for follow-up today much worse on the left side but better on the right side.  Considering his recent injury from stepping down off his  porch he may have suffered a partial tear or or avulsion, we also discussed the possibility of the fracture of the retrocalcaneal spurring.  Agree with an MRI and he will let me know when this is completed and follow-up with me thereafter.  I asked him to obtain a CD to bring with him to the appointment in the event that I cannot see the images directly on PACS.  Long-term for him still cannot do surgery at least until next summer but certainly a possibility he can avoid it because his right side seems much better at this point  Return follow up after MRI (have CD made to bring w/ you).

## 2023-05-23 ENCOUNTER — Telehealth: Payer: Self-pay | Admitting: Family Medicine

## 2023-05-23 NOTE — Telephone Encounter (Signed)
PA completed.

## 2023-05-23 NOTE — Telephone Encounter (Signed)
Patient called said pharmacy has been waiting on a prior authorization for Semaglutide-Weight Management 0.25 MG/0.5ML SOAJ for a while now, can this be expedited.  [facility]: [facility]

## 2023-05-25 ENCOUNTER — Encounter: Payer: Self-pay | Admitting: Family Medicine

## 2023-05-27 ENCOUNTER — Encounter (HOSPITAL_COMMUNITY): Payer: Self-pay

## 2023-05-29 ENCOUNTER — Other Ambulatory Visit: Payer: Self-pay | Admitting: Family Medicine

## 2023-05-29 MED ORDER — ZEPBOUND 2.5 MG/0.5ML ~~LOC~~ SOAJ: 2.5000 mg | SUBCUTANEOUS | 0 refills | Status: DC

## 2023-06-01 ENCOUNTER — Telehealth: Payer: Self-pay | Admitting: Family Medicine

## 2023-06-01 DIAGNOSIS — F411 Generalized anxiety disorder: Secondary | ICD-10-CM | POA: Diagnosis not present

## 2023-06-01 DIAGNOSIS — F4310 Post-traumatic stress disorder, unspecified: Secondary | ICD-10-CM | POA: Diagnosis not present

## 2023-06-01 DIAGNOSIS — F331 Major depressive disorder, recurrent, moderate: Secondary | ICD-10-CM | POA: Diagnosis not present

## 2023-06-01 DIAGNOSIS — R41840 Attention and concentration deficit: Secondary | ICD-10-CM | POA: Diagnosis not present

## 2023-06-01 NOTE — Telephone Encounter (Signed)
tirzepatide (ZEPBOUND) 2.5 MG/0.5ML Pen  Needs pre auth

## 2023-06-05 ENCOUNTER — Encounter: Payer: Self-pay | Admitting: Family Medicine

## 2023-06-05 ENCOUNTER — Telehealth: Payer: Self-pay | Admitting: Family Medicine

## 2023-06-05 NOTE — Telephone Encounter (Signed)
Patient called asking when will the prior authorization be done for the zepound. Patient asking for a call back [phone]

## 2023-06-06 DIAGNOSIS — F331 Major depressive disorder, recurrent, moderate: Secondary | ICD-10-CM | POA: Diagnosis not present

## 2023-06-06 DIAGNOSIS — F411 Generalized anxiety disorder: Secondary | ICD-10-CM | POA: Diagnosis not present

## 2023-06-06 DIAGNOSIS — F4310 Post-traumatic stress disorder, unspecified: Secondary | ICD-10-CM | POA: Diagnosis not present

## 2023-06-06 DIAGNOSIS — R41840 Attention and concentration deficit: Secondary | ICD-10-CM | POA: Diagnosis not present

## 2023-06-08 ENCOUNTER — Encounter (HOSPITAL_COMMUNITY): Payer: Self-pay | Admitting: Psychiatry

## 2023-06-08 ENCOUNTER — Telehealth (INDEPENDENT_AMBULATORY_CARE_PROVIDER_SITE_OTHER): Payer: BC Managed Care – PPO | Admitting: Psychiatry

## 2023-06-08 DIAGNOSIS — F411 Generalized anxiety disorder: Secondary | ICD-10-CM | POA: Diagnosis not present

## 2023-06-08 DIAGNOSIS — R41840 Attention and concentration deficit: Secondary | ICD-10-CM

## 2023-06-08 DIAGNOSIS — F17290 Nicotine dependence, other tobacco product, uncomplicated: Secondary | ICD-10-CM

## 2023-06-08 DIAGNOSIS — G8929 Other chronic pain: Secondary | ICD-10-CM

## 2023-06-08 DIAGNOSIS — F4310 Post-traumatic stress disorder, unspecified: Secondary | ICD-10-CM | POA: Diagnosis not present

## 2023-06-08 DIAGNOSIS — F332 Major depressive disorder, recurrent severe without psychotic features: Secondary | ICD-10-CM

## 2023-06-08 DIAGNOSIS — F410 Panic disorder [episodic paroxysmal anxiety] without agoraphobia: Secondary | ICD-10-CM

## 2023-06-08 NOTE — Patient Instructions (Signed)
Check with your pharmacist to see if the batch of Cymbalta that you were prescribed is one of them affected by the carcinogens recall.  When you get a chance please come by the clinic to drop off the ADHD team results for [place] to scan anterior chart and for me to review.  When you do that, you can pick up the order requisition for the urine toxicology screen in front desk staff can printed out for you to take to the lab that is in the building we share.

## 2023-06-08 NOTE — Telephone Encounter (Signed)
Prior auth had not been done, PA started
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
## 2023-06-10 NOTE — Progress Notes (Unsigned)
   Established Patient Office Visit   Subjective  Patient ID: Jeffrey Hoover, male    DOB: September 01, 1985  Age: 37 y.o. MRN: [id number]  No chief complaint on file.   He  has a past medical history of Chronic pain, Chronic suicidal ideation (09/08/2022), Depression, Does not have primary care provider (09/08/2022), Generalized anxiety disorder with panic attacks, History of alcohol use disorder, Hypertension, and Tobacco use disorder.  HPI Patient presents to the clinic for    ROS    Objective:     There were no vitals taken for this visit. {Vitals History (Optional):23777}  Physical Exam   No results found for any visits on 06/11/23.  The ASCVD Risk score (Arnett DK, et al., 2019) failed to calculate for the following reasons:   The 2019 ASCVD risk score is only valid for ages 78 to 81    Assessment & Plan:  Testosterone deficiency in male    No follow-ups on file.   Cruzita Lederer Newman Nip, FNP

## 2023-06-10 NOTE — Patient Instructions (Signed)
        Great to see you today.  I have refilled the medication(s) we provide.   Delrae Rend MD:  [address] [place], [place]  [address] [phone]     If labs were collected, we will inform you of lab results once received either by echart message or telephone call.   - echart message- for normal results that have been seen by the patient already.   - telephone call: abnormal results or if patient has not viewed results in their echart.   - Please take medications as prescribed. - Follow up with your primary health provider if any health concerns arises. - If symptoms worsen please contact your primary care provider and/or visit the emergency department.

## 2023-06-11 ENCOUNTER — Ambulatory Visit: Payer: BC Managed Care – PPO | Admitting: Family Medicine

## 2023-06-11 ENCOUNTER — Encounter: Payer: Self-pay | Admitting: Family Medicine

## 2023-06-11 VITALS — BP 138/97 | HR 83 | Ht 73.0 in | Wt 281.8 lb

## 2023-06-11 DIAGNOSIS — R5383 Other fatigue: Secondary | ICD-10-CM | POA: Insufficient documentation

## 2023-06-11 DIAGNOSIS — E291 Testicular hypofunction: Secondary | ICD-10-CM | POA: Diagnosis not present

## 2023-06-11 DIAGNOSIS — G4730 Sleep apnea, unspecified: Secondary | ICD-10-CM | POA: Insufficient documentation

## 2023-06-11 NOTE — Assessment & Plan Note (Signed)
Stop Bang Score 6 points  Referral placed to sleep studies

## 2023-06-11 NOTE — Assessment & Plan Note (Addendum)
Patient complains of chronic fatigue and would like to have his Testerone levels check  Recents labs BMP, Thyroid , VIT B 12 panel normal   Vit d levels low which can contribute to patient fatigue symptoms  Advise to taking  over the counter supplements of vitamin D 000 IU/day to prevent low vitamin D levels. Consuming Vitamin D rich food sources include fish, salmon, sardines, egg yolks, red meat, liver, oranges, soy milk.   Iron panel ordered to rule out iron deficiency anemia  Referral to sleep studies to rule out sleep apnea   Advise to  aim for a balanced diet rich in lean proteins, healthy fats, and vegetables, and ensure sufficient sleep each night, as sleep is vital for hormone production. Managing stress through relaxation techniques, such as mindfulness or yoga, can also help maintain hormone balance. Avoid excessive alcohol intake and limit processed foods.

## 2023-06-13 LAB — IRON,TIBC AND FERRITIN PANEL
Ferritin: 364 ng/mL (ref 30–400)
Iron Saturation: 38 % (ref 15–55)
Iron: 113 ug/dL (ref 38–169)
Total Iron Binding Capacity: 295 ug/dL (ref 250–450)
UIBC: 182 ug/dL (ref 111–343)

## 2023-06-13 LAB — TESTOSTERONE: Testosterone: 292 ng/dL (ref 264–916)

## 2023-06-26 ENCOUNTER — Telehealth: Payer: Self-pay | Admitting: Family Medicine

## 2023-06-26 NOTE — Telephone Encounter (Signed)
Copied from CRM [id number]. Topic: Clinical - Medication Question >> Jun 26, 2023 10:05 AM Desma Mcgregor wrote: Reason for CRM: Pt calling in about tirzepatide (ZEPBOUND) 2.5 MG/0.5ML Pen. Going out of town [facility] 07/02/23. Pre auth still not complete. Looking to see if that can be done before he leaves. CB# [phone]

## 2023-06-27 ENCOUNTER — Ambulatory Visit (HOSPITAL_COMMUNITY): Payer: BC Managed Care – PPO | Admitting: Clinical

## 2023-06-29 ENCOUNTER — Telehealth (INDEPENDENT_AMBULATORY_CARE_PROVIDER_SITE_OTHER): Payer: BC Managed Care – PPO | Admitting: Psychiatry

## 2023-06-29 ENCOUNTER — Encounter (HOSPITAL_COMMUNITY): Payer: Self-pay | Admitting: Psychiatry

## 2023-06-29 DIAGNOSIS — F4310 Post-traumatic stress disorder, unspecified: Secondary | ICD-10-CM

## 2023-06-29 DIAGNOSIS — G8929 Other chronic pain: Secondary | ICD-10-CM

## 2023-06-29 DIAGNOSIS — F17290 Nicotine dependence, other tobacco product, uncomplicated: Secondary | ICD-10-CM

## 2023-06-29 DIAGNOSIS — F332 Major depressive disorder, recurrent severe without psychotic features: Secondary | ICD-10-CM | POA: Diagnosis not present

## 2023-06-29 DIAGNOSIS — F411 Generalized anxiety disorder: Secondary | ICD-10-CM

## 2023-06-29 DIAGNOSIS — R41840 Attention and concentration deficit: Secondary | ICD-10-CM

## 2023-06-29 DIAGNOSIS — F410 Panic disorder [episodic paroxysmal anxiety] without agoraphobia: Secondary | ICD-10-CM

## 2023-06-29 MED ORDER — DULOXETINE HCL 30 MG PO CPEP
30.0000 mg | ORAL_CAPSULE | Freq: Every day | ORAL | 1 refills | Status: DC
Start: 2023-06-29 — End: 2023-07-30
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
## 2023-06-29 NOTE — Patient Instructions (Addendum)
We decreased the cymbalta to 30mg  daily for the next week to have a slower taper before you leave the country. When you get back be sure to bring by the results of your ADHD test and get the urine drug screen.

## 2023-07-01 NOTE — Telephone Encounter (Signed)
Any update on this?

## 2023-07-16 NOTE — Telephone Encounter (Signed)
PA has been sent - waiting on response

## 2023-07-17 ENCOUNTER — Encounter: Payer: Self-pay | Admitting: Family Medicine

## 2023-07-23 ENCOUNTER — Other Ambulatory Visit: Payer: Self-pay | Admitting: Family Medicine

## 2023-07-23 DIAGNOSIS — F332 Major depressive disorder, recurrent severe without psychotic features: Secondary | ICD-10-CM | POA: Diagnosis not present

## 2023-07-23 DIAGNOSIS — R41840 Attention and concentration deficit: Secondary | ICD-10-CM | POA: Diagnosis not present

## 2023-07-23 DIAGNOSIS — F17290 Nicotine dependence, other tobacco product, uncomplicated: Secondary | ICD-10-CM | POA: Diagnosis not present

## 2023-07-24 ENCOUNTER — Other Ambulatory Visit: Payer: Self-pay | Admitting: Family Medicine

## 2023-07-24 LAB — DRUG MONITOR, PANEL 1, SCREEN, URINE
Amphetamines: NEGATIVE ng/mL (ref <500)
Barbiturates: NEGATIVE ng/mL (ref <300)
Benzodiazepines: NEGATIVE ng/mL (ref <100)
Cocaine Metabolite: NEGATIVE ng/mL (ref <150)
Creatinine: 56.1 mg/dL (ref >=20.0)
Marijuana Metabolite: NEGATIVE ng/mL (ref <20)
Methadone Metabolite: NEGATIVE ng/mL (ref <100)
Opiates: NEGATIVE ng/mL (ref <100)
Oxidant: NEGATIVE ug/mL (ref <200)
Oxycodone: NEGATIVE ng/mL (ref <100)
Phencyclidine: NEGATIVE ng/mL (ref <25)
pH: 6 (ref 4.5–9.0)

## 2023-07-24 LAB — DM TEMPLATE

## 2023-07-24 NOTE — Telephone Encounter (Signed)
Working on [employer]

## 2023-07-25 ENCOUNTER — Ambulatory Visit (HOSPITAL_COMMUNITY): Payer: BC Managed Care – PPO | Admitting: Clinical

## 2023-07-25 ENCOUNTER — Encounter: Payer: Self-pay | Admitting: Family Medicine

## 2023-07-25 DIAGNOSIS — F331 Major depressive disorder, recurrent, moderate: Secondary | ICD-10-CM | POA: Diagnosis not present

## 2023-07-25 DIAGNOSIS — F4310 Post-traumatic stress disorder, unspecified: Secondary | ICD-10-CM

## 2023-07-25 DIAGNOSIS — F411 Generalized anxiety disorder: Secondary | ICD-10-CM | POA: Diagnosis not present

## 2023-07-25 DIAGNOSIS — F410 Panic disorder [episodic paroxysmal anxiety] without agoraphobia: Secondary | ICD-10-CM

## 2023-07-25 NOTE — Telephone Encounter (Signed)
No. The prescribed medication was wegovy, the form is in your file to be signed for completion. Do you want to prescribe zepbound and start a new PA?

## 2023-07-25 NOTE — Telephone Encounter (Signed)
Please check patients messages from 10/22, 11/12 and 12/3 patient stated wanted to try zepound instead of wegovy On the paper work I signed for zepound. Thanks

## 2023-07-25 NOTE — Progress Notes (Signed)
IN PERSON   I connected with Jeffrey Hoover on 07/25/23 at 1:00 PM EDT in person and verified that I am speaking with the correct person using two identifiers.   Location: Patient: Office Provider: Office   I discussed the limitations of evaluation and management by telemedicine and the availability of in person appointments. The patient expressed understanding and agreed to proceed. ( IN PERSON)     THERAPIST PROGRESS NOTE   Session Time: 1:00 PM-1:55 PM   Participation Level: Active   Behavioral Response: CasualAlertAnxious   Type of Therapy: Individual Therapy   Treatment Goals addressed: Coping for Anxiety/PTSD/Depression   Interventions: CBT, Motivational Interviewing, Solution Focused and Supportive   Summary: Jeffrey Hoover is a 37 y.o. male who presents with MDD/GAD/PTSD . The OPT therapist worked with the patient for his scheduled session. The OPT therapist utilized Motivational Interviewing to assist in creating therapeutic repore. The patient spoke about his interactions with his  partner in the home and the conflict the he feels is connected to his difficulty to focus on and complete one task at a time. The patient notes that he spoke with the company that Dr. Adrian Blackwater referred him to for more formal test for ADHD which the patient has completed and will be meeting with Dr. Adrian Blackwater as a follow up this week. 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 The OPT therapist overviewed patient basic need areas including sleep cycle, eating habits, hygiene, and physical exercise. The OPT therapist overviewed coping strategies to further assist the patient in management of his MH symptoms. The OPT therapist over-viewed with the patient upcoming appointments as listed in his MyChart    Suicidal/Homicidal: Nowithout  intent/plan   Therapist Response: The OPT therapist worked with the patient for the patients scheduled session. The patient was engaged in his session and gave feedback in relation to triggers, symptoms, and behavior responses over the past few weeks. The OPT therapist worked with the patient utilizing an in session Cognitive Behavioral Therapy exercise. The patient was responsive in the session and verbalized," We made it back from our international trip to [place] ". The OPT therapist worked with the patient over-viewing implementation of coping strategies. The patient verbalized he was able to connect with the [facility] based out of [place], [place] to get formal ADHD testing completed and has completed testing got results and did a drug screen all steps needed to move forward with med management adjustments in treatment of ADHD.  The OPT therapist worked with the patient overviewing appointments listed in the patients MyChart.  The OPT therapist will continue treatment work with the patient in his next scheduled session.     Plan: Return again in 2/3 weeks.   Diagnosis:      Axis I: GAD/PTSD/MDD                           Axis II: No diagnosis     Collaboration of Care: Overview of patient involvement in the med therapy program with Dr. Adrian Blackwater.   Patient/Guardian was advised Release of Information must be obtained prior to any record release in order to collaborate their care with an outside provider. Patient/Guardian was advised if they have not already done so to contact the registration department to sign all  necessary forms in order for [place] to release information regarding their care.    Consent: Patient/Guardian gives verbal consent for treatment and assignment of benefits for services provided during this visit. Patient/Guardian expressed understanding and agreed to proceed.    I discussed the assessment and treatment plan with the patient. The patient was provided an  opportunity to ask questions and all were answered. The patient agreed with the plan and demonstrated an understanding of the instructions.   The patient was advised to call back or seek an in-person evaluation if the symptoms worsen or if the condition fails to improve as anticipated.   I provided 55 minutes of face-to-face time during this encounter.   Winfred Burn, LCSW    07/25/2023

## 2023-07-28 ENCOUNTER — Other Ambulatory Visit: Payer: Self-pay | Admitting: Family Medicine

## 2023-07-28 MED ORDER — SEMAGLUTIDE-WEIGHT MANAGEMENT 0.25 MG/0.5ML ~~LOC~~ SOAJ: 0.2500 mg | SUBCUTANEOUS | 0 refills | Status: DC

## 2023-07-28 NOTE — Telephone Encounter (Signed)
Patient now wants wegovy instead of zepound I will place the order

## 2023-07-30 ENCOUNTER — Telehealth (INDEPENDENT_AMBULATORY_CARE_PROVIDER_SITE_OTHER): Payer: BC Managed Care – PPO | Admitting: Psychiatry

## 2023-07-30 ENCOUNTER — Encounter (HOSPITAL_COMMUNITY): Payer: Self-pay | Admitting: Psychiatry

## 2023-07-30 DIAGNOSIS — F17290 Nicotine dependence, other tobacco product, uncomplicated: Secondary | ICD-10-CM

## 2023-07-30 DIAGNOSIS — F4310 Post-traumatic stress disorder, unspecified: Secondary | ICD-10-CM

## 2023-07-30 DIAGNOSIS — F411 Generalized anxiety disorder: Secondary | ICD-10-CM | POA: Diagnosis not present

## 2023-07-30 DIAGNOSIS — F410 Panic disorder [episodic paroxysmal anxiety] without agoraphobia: Secondary | ICD-10-CM

## 2023-07-30 DIAGNOSIS — F902 Attention-deficit hyperactivity disorder, combined type: Secondary | ICD-10-CM

## 2023-07-30 DIAGNOSIS — F332 Major depressive disorder, recurrent severe without psychotic features: Secondary | ICD-10-CM | POA: Diagnosis not present

## 2023-07-30 DIAGNOSIS — G8929 Other chronic pain: Secondary | ICD-10-CM

## 2023-07-30 MED ORDER — AMPHETAMINE-DEXTROAMPHETAMINE 20 MG PO TABS
20.0000 mg | ORAL_TABLET | Freq: Every day | ORAL | 0 refills | Status: DC
Start: 2023-08-28 — End: 2023-09-13

## 2023-07-30 MED ORDER — DULOXETINE HCL 30 MG PO CPEP
30.0000 mg | ORAL_CAPSULE | Freq: Every day | ORAL | 0 refills | Status: DC
Start: 2023-07-30 — End: 2023-10-08

## 2023-07-30 MED ORDER — AMPHETAMINE-DEXTROAMPHETAMINE 20 MG PO TABS
20.0000 mg | ORAL_TABLET | Freq: Every day | ORAL | 0 refills | Status: DC
Start: 2023-07-30 — End: 2023-09-06

## 2023-07-30 NOTE — Telephone Encounter (Signed)
Thank you :)

## 2023-07-30 NOTE — Patient Instructions (Addendum)
We added adderall immediate release 20mg  once daily to your regimen today. We otherwise kept the cymbalta at 30mg  once daily today.  Using the books at home can be effective but working with a [place] provider will likely have the best results for couple's therapy.
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
## 2023-07-31 NOTE — Telephone Encounter (Signed)
Provider ordered medication

## 2023-08-01 ENCOUNTER — Telehealth: Payer: Self-pay | Admitting: Family Medicine

## 2023-08-01 ENCOUNTER — Other Ambulatory Visit: Payer: Self-pay | Admitting: Family Medicine

## 2023-08-02 NOTE — Telephone Encounter (Signed)
Disregard

## 2023-08-03 ENCOUNTER — Other Ambulatory Visit: Payer: Self-pay | Admitting: Family Medicine

## 2023-08-03 ENCOUNTER — Telehealth: Payer: Self-pay

## 2023-08-03 MED ORDER — ZEPBOUND 2.5 MG/0.5ML ~~LOC~~ SOAJ: 2.5000 mg | SUBCUTANEOUS | 0 refills | Status: DC

## 2023-08-05 ENCOUNTER — Other Ambulatory Visit: Payer: Self-pay | Admitting: Family Medicine

## 2023-08-05 DIAGNOSIS — I10 Essential (primary) hypertension: Secondary | ICD-10-CM

## 2023-08-06 NOTE — Telephone Encounter (Signed)
Medication approved waiting on physician response.

## 2023-08-14 ENCOUNTER — Encounter: Payer: Self-pay | Admitting: Sports Medicine

## 2023-08-14 ENCOUNTER — Ambulatory Visit: Payer: BC Managed Care – PPO | Admitting: Sports Medicine

## 2023-08-14 DIAGNOSIS — M7751 Other enthesopathy of right foot: Secondary | ICD-10-CM

## 2023-08-14 DIAGNOSIS — M7752 Other enthesopathy of left foot: Secondary | ICD-10-CM

## 2023-08-14 DIAGNOSIS — M6788 Other specified disorders of synovium and tendon, other site: Secondary | ICD-10-CM

## 2023-08-14 DIAGNOSIS — M9262 Juvenile osteochondrosis of tarsus, left ankle: Secondary | ICD-10-CM

## 2023-08-14 DIAGNOSIS — M9261 Juvenile osteochondrosis of tarsus, right ankle: Secondary | ICD-10-CM

## 2023-08-14 MED ORDER — PREDNISONE 10 MG PO TABS
10.0000 mg | ORAL_TABLET | Freq: Three times a day (TID) | ORAL | 0 refills | Status: AC
Start: 2023-08-14 — End: 2023-08-24

## 2023-08-14 NOTE — Progress Notes (Signed)
 Jeffrey Hoover - 37 y.o. male MRN [id number]  Date of birth: 1986/05/26  Office Visit Note: Visit Date: 08/14/2023 PCP: Jeffrey Wilhelmena Lloyd Hilario, FNP Referred by: Del Orbe Polanco, Ilian*  Subjective: Chief Complaint  Patient presents with   Left Heel - Follow-up, Pain   Right Heel - Follow-up, Pain   HPI: Jeffrey Hoover is a pleasant 37 y.o. male who presents today for follow-up of bilateral heel pain with known Haglund deformity.   Jeffrey Hoover completed 5 treatments of extracorporeal shockwave therapy and other conservative treatment back in August and September of this year, which gave him quite good relief from some of his pain.  He does have notable Haglund deformity and bony fragment of the calcaneus.  He was planning on obtaining an MRI of the left foot but did have difficulties with his insurance scheduling and covering this.  He recently was out of the country for work and did do a lot of walking, walk between 10 and 14 miles a day, maybe 16 miles total and he actually felt pretty well during this time, but in the weeks returning his heels had bothered him again, left is worse than the right.  He is not taking any medication for this, he has stopped his diclofenac  at my discretion months ago.  Pertinent ROS were reviewed with the patient and found to be negative unless otherwise specified above in HPI.   Assessment & Plan: Visit Diagnoses:  1. Retrocalcaneal bursitis (back of heel), left   2. Retrocalcaneal bursitis (back of heel), right   3. Haglund's deformity of both heels   4. Achilles tendonosis    Plan: Sidra is dealing with an acute exacerbation of his bilateral heel pain with notable Haglund deformity with insertional Achilles tendinopathy and concomitant retrocalcaneal bursitis.  His left heel is much worse and he does have increased swelling of the retrocalcaneal bursitis, which I think is the main driver of his pain currently.  He did receive good relief from  extracorporeal shockwave therapy months ago, he would like to restart this today, we did proceed with bilateral ESWT today, patient tolerated well. Given the exacerbation of his retrocalcaneal bursitis, we discussed starting short course of prednisone  for 7 to 10 days to help the inflammatory component of this.  He will follow-up over the next week and we will plan for a few treatments of extracorporeal shockwave therapy and reevaluation.  He will continue his orthotics and shoes for support.  He is not interested in proceeding with MRI of the foot at this time given the start of the fiscal new year, we will consider this if at some point he is not finding benefit from conservative treatment and considering surgical fixation.  Follow-up: Return in about 1 week (around 08/21/2023) for for b/l heel pain SWT (reg visit).   Meds & Orders:  Meds ordered this encounter  Medications   predniSONE  (DELTASONE ) 10 MG tablet    Sig: Take 1 tablet (10 mg total) by mouth in the morning, at noon, and at bedtime for 10 days.    Dispense:  30 tablet    Refill:  0   No orders of the defined types were placed in this encounter.    Procedures: Procedure: ECSWT Indications:  Achilles tendinitis, Retrocalcaneal bursitis   Procedure Details Consent: Risks of procedure as well as the alternatives and risks of each were explained to the patient.  Verbal consent for procedure obtained. Time Out: Verified patient identification, verified procedure, site  was marked, verified correct patient position. The area was cleaned with alcohol Hoover.     The right achilles and superior calcaneus was targeted for Extracorporeal shockwave therapy.    Preset: Achillodynia Power Level: 120 mJ Frequency: 14 Hz Impulse/cycles: 3100  Head size: Regular   Patient tolerated procedure well without immediate complications.   The left achilles and superior calcaneus was targeted for Extracorporeal shockwave therapy.    Preset:  Achillodynia Power Level: 120 mJ Frequency: 14 Hz Impulse/cycles: 3300 Head size: Regular   Patient tolerated procedure well without immediate complications.      Clinical History: No specialty comments available.  He reports that he has been smoking cigarettes and e-cigarettes. He has quit using smokeless tobacco.  His smokeless tobacco use included chew.  Recent Labs    09/19/22 0800 05/09/23 0941  HGBA1C 5.3 5.1    Objective:    Physical Exam  Gen: Well-appearing, in no acute distress; non-toxic CV: Well-perfused. Warm.  Resp: Breathing unlabored on room air; no wheezing. Psych: Fluid speech in conversation; appropriate affect; normal thought process  Ortho Exam - Bilateral heels: There is significant bony bossing of the superior aspect of the calcaneus bilaterally with notable Haglund deformity.  There is swelling with warmth over the left greater than right retrocalcaneal bursa, indicative of retrocalcaneal bursitis.  There is no tenderness about the mid or proximal aspect of the Achilles tendon.  There is notable xerosis of bilateral heels.  Imaging:  MR ANKLE RIGHT WO CONTRAST CLINICAL DATA:  Achilles tendinitis, chronic pain and swelling  EXAM: MRI OF THE RIGHT ANKLE WITHOUT CONTRAST  TECHNIQUE: Multiplanar, multisequence MR imaging of the ankle was performed. No intravenous contrast was administered.  COMPARISON:  None Available.  FINDINGS: TENDONS  Peroneal: Moderate tendinosis of the peroneus longus with a small interstitial tear at the level of the lateral malleolus. Mild peroneal tenosynovitis. Peroneal brevis intact.  Posteromedial: Posterior tibial tendon intact. Flexor hallucis longus tendon intact. Flexor digitorum longus tendon intact. Mild tenosynovitis of the posterior tibial tendon.  Anterior: Tibialis anterior tendon intact. Extensor hallucis longus tendon intact Extensor digitorum longus tendon intact.  Achilles: Moderate tendinosis of  the Achilles tendon with subcortical reactive marrow edema at the calcaneal insertion. Severe retrocalcaneal bursitis.  Plantar Fascia: Intact.  LIGAMENTS  Lateral: Anterior talofibular ligament intact. Calcaneofibular ligament intact. Posterior talofibular ligament intact. Anterior and posterior tibiofibular ligaments intact.  Medial: Deltoid ligament intact. Spring ligament intact.  CARTILAGE  Ankle Joint: No joint effusion. Normal ankle mortise. No chondral defect.  Subtalar Joints/Sinus Tarsi: Normal subtalar joints. No subtalar joint effusion. Normal sinus tarsi.  Bones: No aggressive osseous lesion. No fracture or dislocation.  Soft Tissue: No fluid collection or hematoma. Muscles are normal without edema or atrophy. Tarsal tunnel is normal.  IMPRESSION: 1. Moderate tendinosis of the Achilles tendon with subcortical reactive marrow edema at the calcaneal insertion. Severe retrocalcaneal bursitis. 2. Moderate tendinosis of the peroneus longus with a small interstitial tear at the level of the lateral malleolus. Mild peroneal tenosynovitis. 3. Mild tenosynovitis of the posterior tibial tendon.  Electronically Signed   By: Julaine Blanch M.D.   On: 02/18/2023 06:54  Past Medical/Family/Surgical/Social History: Medications & Allergies reviewed per EMR, new medications updated. 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 Social History   Occupational History   Not on file  Tobacco Use   Smoking status: Every Day    Current packs/day: 0.00    Types: Cigarettes, E-cigarettes    Last attempt to quit: 2020    Years since quitting: 5.0   Smokeless tobacco: Former    Types: Chew   Tobacco comments:    Vape bottle lasts 1 month as of 12/28/22  Vaping Use   Vaping status: Every Day   Substances: Nicotine-salt  Substance and Sexual Activity   Alcohol use: Not Currently    Comment: Previously half of fifth of vodka 4 out of 7 nights per week none in the last 5 years   Drug use: Not Currently    Types: Cocaine, Marijuana, Psilocybin    Comment: Last used in 2017-2018   Sexual activity: Yes    Partners: Female

## 2023-08-14 NOTE — Progress Notes (Signed)
 Patient says that he has not gotten his MRI yet because of difficulties with insurance and scheduling. He says that he was out of the country for 12-14 days where he was walking a lot and did not have any pain. He says that when he got back his pain returned with left worse than right, and has felt inflamed for the last 3 weeks.

## 2023-08-26 ENCOUNTER — Encounter (HOSPITAL_COMMUNITY): Payer: Self-pay

## 2023-08-30 ENCOUNTER — Encounter: Payer: Self-pay | Admitting: Family Medicine

## 2023-09-05 ENCOUNTER — Ambulatory Visit (HOSPITAL_COMMUNITY): Payer: BC Managed Care – PPO | Admitting: Clinical

## 2023-09-06 ENCOUNTER — Encounter: Payer: Self-pay | Admitting: Family Medicine

## 2023-09-06 ENCOUNTER — Ambulatory Visit: Payer: BC Managed Care – PPO | Admitting: Family Medicine

## 2023-09-06 VITALS — BP 162/109 | HR 103 | Ht 73.0 in | Wt 282.0 lb

## 2023-09-06 DIAGNOSIS — E669 Obesity, unspecified: Secondary | ICD-10-CM

## 2023-09-06 DIAGNOSIS — I10 Essential (primary) hypertension: Secondary | ICD-10-CM

## 2023-09-06 MED ORDER — SEMAGLUTIDE-WEIGHT MANAGEMENT 0.5 MG/0.5ML ~~LOC~~ SOAJ: 0.5000 mg | SUBCUTANEOUS | 0 refills | Status: AC

## 2023-09-06 MED ORDER — LISINOPRIL 5 MG PO TABS: 5.0000 mg | ORAL_TABLET | Freq: Every day | ORAL | 2 refills | Status: DC

## 2023-09-06 MED ORDER — CLINDAMYCIN PHOSPHATE 1 % EX LOTN: TOPICAL_LOTION | Freq: Two times a day (BID) | CUTANEOUS | 0 refills | Status: AC | PRN

## 2023-09-06 MED ORDER — CLOBETASOL PROPIONATE 0.05 % EX SOLN: 1.0000 | Freq: Two times a day (BID) | CUTANEOUS | 1 refills | Status: AC

## 2023-09-06 MED ORDER — MUPIROCIN 2 % EX OINT: 1.0000 | TOPICAL_OINTMENT | Freq: Two times a day (BID) | CUTANEOUS | 0 refills | Status: AC

## 2023-09-06 NOTE — Progress Notes (Signed)
Established Patient Office Visit   Subjective  Patient ID: Jeffrey Hoover, male    DOB: 04-25-1986  Age: 38 y.o. MRN: [id number]  Chief Complaint  Patient presents with   Medical Management of Chronic Issues    4 month HTN / wt. Loss management  Has not seen significant loss w/ wt. Loss     He  has a past medical history of Chronic pain, Chronic suicidal ideation (09/08/2022), Depression, Does not have primary care provider (09/08/2022), Generalized anxiety disorder with panic attacks, History of alcohol use disorder, Hypertension, and Tobacco use disorder.  HPI Patient presents to the clinic for chronic follow up. For the details of today's visit, please refer to assessment and plan.   Review of Systems  Constitutional:  Negative for chills and fever.  Respiratory:  Negative for shortness of breath.   Cardiovascular:  Negative for chest pain.  Skin:  Positive for itching and rash.      Objective:     BP (!) 162/109   Pulse (!) 103   Ht 6\' 1"  (1.854 m)   Wt 282 lb (127.9 kg)   SpO2 98%   BMI 37.21 kg/m  BP Readings from Last 3 Encounters:  09/06/23 (!) 162/109  06/11/23 (!) 138/97  05/07/23 124/81      Physical Exam Vitals reviewed.  Constitutional:      General: He is not in acute distress.    Appearance: Normal appearance. He is not ill-appearing, toxic-appearing or diaphoretic.  HENT:     Head: Normocephalic.  Eyes:     General:        Right eye: No discharge.        Left eye: No discharge.     Conjunctiva/sclera: Conjunctivae normal.  Cardiovascular:     Rate and Rhythm: Normal rate.     Pulses: Normal pulses.     Heart sounds: Normal heart sounds.  Pulmonary:     Effort: Pulmonary effort is normal. No respiratory distress.     Breath sounds: Normal breath sounds.  Abdominal:     General: Bowel sounds are normal.     Palpations: Abdomen is soft.     Tenderness: There is no abdominal tenderness. There is no guarding.  Musculoskeletal:         General: Normal range of motion.     Cervical back: Normal range of motion.  Skin:    Capillary Refill: Capillary refill takes less than 2 seconds.     Findings: Erythema, lesion and rash present.     Comments: Diffuse erythema rash with dried blood observed following excessive scratching  Neurological:     Mental Status: He is alert.     Coordination: Coordination normal.     Gait: Gait normal.  Psychiatric:        Mood and Affect: Mood normal.        Behavior: Behavior normal.      No results found for any visits on 09/06/23.  The ASCVD Risk score (Arnett DK, et al., 2019) failed to calculate for the following reasons:   The 2019 ASCVD risk score is only valid for ages 27 to 15    Assessment & Plan:  Primary hypertension Assessment & Plan: Vitals:   09/06/23 1350 09/06/23 1410  BP: (!) 137/95 (!) 162/109  Blood pressure is not well-controlled today. The following adjustments and recommendations have been made: Medication adjustment: The patient is advised to take two Lisinopril 5 mg tablets daily, for a total dose of  10 mg per day. Monitoring: Patient is instructed to track blood pressure readings at home and report trends via MyChart for ongoing evaluation in 4 weeks/ Lifestyle modifications: Continued discussion on adopting the DASH diet, maintaining a low-sodium intake, and engaging in regular physical activity, aiming for at least 150 minutes of exercise per week.  Orders: -     Lisinopril; Take 1 tablet (5 mg total) by mouth daily.  Dispense: 30 tablet; Refill: 2  Other orders -     Semaglutide-Weight Management; Inject 0.5 mg into the skin once a week for 28 days.  Dispense: 2 mL; Refill: 0 -     Mupirocin; Apply 1 Application topically 2 (two) times daily.  Dispense: 30 g; Refill: 0 -     Clindamycin Phosphate; Apply topically 2 (two) times daily as needed. Apply topically 2 (two) times daily as needed.  Dispense: 60 mL; Refill: 0 -     Clobetasol Propionate; Apply 1  Application topically 2 (two) times daily.  Dispense: 50 mL; Refill: 1    Return in about 4 months (around 01/04/2024), or if symptoms worsen or fail to improve, for Weight Loss Mangment, hypertension.   Cruzita Lederer Newman Nip, FNP

## 2023-09-06 NOTE — Assessment & Plan Note (Signed)
Vitals:   09/06/23 1350 09/06/23 1410  BP: (!) 137/95 (!) 162/109  Blood pressure is not well-controlled today. The following adjustments and recommendations have been made: Medication adjustment: The patient is advised to take two Lisinopril 5 mg tablets daily, for a total dose of 10 mg per day. Monitoring: Patient is instructed to track blood pressure readings at home and report trends via MyChart for ongoing evaluation in 4 weeks/ Lifestyle modifications: Continued discussion on adopting the DASH diet, maintaining a low-sodium intake, and engaging in regular physical activity, aiming for at least 150 minutes of exercise per week.

## 2023-09-06 NOTE — Assessment & Plan Note (Signed)
Increased dosage-  Wegovy 0.5 mg weekly injection Discussed Eat a Balanced Diet: Focus on whole, nutrient-dense foods like lean proteins, vegetables, fruits, whole grains, and healthy fats while avoiding processed and sugary foods. Stay Active: Incorporate at least 30 minutes of moderate physical activity most days of the week, such as walking, jogging, or strength training. Hydrate and Rest: Drink plenty of water throughout the day and ensure you get 7-9 hours of quality sleep each night to support metabolism and recovery. Practice Portion Control: Use smaller plates, measure portions, and eat mindfully to avoid overeating and manage calorie intake effectively.

## 2023-09-06 NOTE — Patient Instructions (Signed)

## 2023-09-10 ENCOUNTER — Ambulatory Visit: Payer: BC Managed Care – PPO | Admitting: Sports Medicine

## 2023-09-10 ENCOUNTER — Encounter: Payer: Self-pay | Admitting: Sports Medicine

## 2023-09-10 DIAGNOSIS — M7752 Other enthesopathy of left foot: Secondary | ICD-10-CM

## 2023-09-10 DIAGNOSIS — M25552 Pain in left hip: Secondary | ICD-10-CM

## 2023-09-10 DIAGNOSIS — M9261 Juvenile osteochondrosis of tarsus, right ankle: Secondary | ICD-10-CM | POA: Diagnosis not present

## 2023-09-10 DIAGNOSIS — M7751 Other enthesopathy of right foot: Secondary | ICD-10-CM

## 2023-09-10 DIAGNOSIS — M9262 Juvenile osteochondrosis of tarsus, left ankle: Secondary | ICD-10-CM

## 2023-09-10 MED ORDER — DICLOFENAC SODIUM 75 MG PO TBEC: 75.0000 mg | DELAYED_RELEASE_TABLET | Freq: Two times a day (BID) | ORAL | 0 refills | Status: AC
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
## 2023-09-10 NOTE — Progress Notes (Signed)
Patient is feeling a bit better than last time. He says that he missed the last 2 days of his Prednisone so he stopped taking it early, but he did not notice much difference with that.

## 2023-09-13 ENCOUNTER — Encounter (HOSPITAL_COMMUNITY): Payer: Self-pay | Admitting: Psychiatry

## 2023-09-13 ENCOUNTER — Telehealth (HOSPITAL_COMMUNITY): Payer: BC Managed Care – PPO | Admitting: Psychiatry

## 2023-09-13 DIAGNOSIS — F902 Attention-deficit hyperactivity disorder, combined type: Secondary | ICD-10-CM

## 2023-09-13 DIAGNOSIS — F410 Panic disorder [episodic paroxysmal anxiety] without agoraphobia: Secondary | ICD-10-CM

## 2023-09-13 DIAGNOSIS — I10 Essential (primary) hypertension: Secondary | ICD-10-CM

## 2023-09-13 DIAGNOSIS — F332 Major depressive disorder, recurrent severe without psychotic features: Secondary | ICD-10-CM | POA: Diagnosis not present

## 2023-09-13 DIAGNOSIS — F411 Generalized anxiety disorder: Secondary | ICD-10-CM | POA: Diagnosis not present

## 2023-09-13 DIAGNOSIS — F4310 Post-traumatic stress disorder, unspecified: Secondary | ICD-10-CM

## 2023-09-13 DIAGNOSIS — F17290 Nicotine dependence, other tobacco product, uncomplicated: Secondary | ICD-10-CM

## 2023-09-13 MED ORDER — AMPHETAMINE-DEXTROAMPHETAMINE 20 MG PO TABS: 20.0000 mg | ORAL_TABLET | Freq: Every day | ORAL | 0 refills | Status: DC

## 2023-09-13 MED ORDER — AMPHETAMINE-DEXTROAMPHET ER 20 MG PO CP24: 20.0000 mg | ORAL_CAPSULE | Freq: Every day | ORAL | 0 refills | Status: DC
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
## 2023-09-13 NOTE — Patient Instructions (Signed)
We increased the Adderall to 20 mg of the extended release formulation to take first thing in the morning and a 20 mg immediate release to take in the afternoon or when you notice the morning dose is beginning to wane.  Keep checking her blood pressure is you have been doing and I like your idea of having you and Kirt Boys keep track of what you are noticing symptom wise as we increase the dose.

## 2023-09-23 ENCOUNTER — Encounter: Payer: Self-pay | Admitting: Family Medicine

## 2023-09-25 ENCOUNTER — Other Ambulatory Visit: Payer: Self-pay | Admitting: Family Medicine

## 2023-09-25 MED ORDER — LISINOPRIL 10 MG PO TABS: 10.0000 mg | ORAL_TABLET | Freq: Every day | ORAL | 3 refills | Status: DC

## 2023-09-28 ENCOUNTER — Other Ambulatory Visit: Payer: Self-pay | Admitting: Family Medicine

## 2023-09-28 ENCOUNTER — Encounter (HOSPITAL_COMMUNITY): Payer: Self-pay

## 2023-09-28 ENCOUNTER — Other Ambulatory Visit (HOSPITAL_COMMUNITY): Payer: Self-pay | Admitting: Psychiatry

## 2023-09-28 DIAGNOSIS — F902 Attention-deficit hyperactivity disorder, combined type: Secondary | ICD-10-CM

## 2023-09-28 MED ORDER — AMPHETAMINE-DEXTROAMPHET ER 20 MG PO CP24: 40.0000 mg | ORAL_CAPSULE | Freq: Every day | ORAL | 0 refills | Status: DC

## 2023-09-28 MED ORDER — SEMAGLUTIDE-WEIGHT MANAGEMENT 1 MG/0.5ML ~~LOC~~ SOAJ: 1.0000 mg | SUBCUTANEOUS | 0 refills | Status: AC

## 2023-09-28 NOTE — Telephone Encounter (Signed)
Dr John Giovanni pt

## 2023-09-28 NOTE — Progress Notes (Signed)
Titration of Adderall XR to 40 mg in the morning with 20 mg immediate release in the afternoon going well.  We will call an updated dose to avoid lapse in medication of the XR.

## 2023-09-29 DIAGNOSIS — S0181XA Laceration without foreign body of other part of head, initial encounter: Secondary | ICD-10-CM | POA: Diagnosis not present

## 2023-09-29 DIAGNOSIS — Z23 Encounter for immunization: Secondary | ICD-10-CM | POA: Diagnosis not present

## 2023-10-01 ENCOUNTER — Other Ambulatory Visit (HOSPITAL_COMMUNITY): Payer: Self-pay | Admitting: Psychiatry

## 2023-10-01 DIAGNOSIS — F902 Attention-deficit hyperactivity disorder, combined type: Secondary | ICD-10-CM

## 2023-10-01 MED ORDER — AMPHETAMINE-DEXTROAMPHET ER 20 MG PO CP24: 40.0000 mg | ORAL_CAPSULE | Freq: Every day | ORAL | 0 refills | Status: DC

## 2023-10-01 MED ORDER — AMPHETAMINE-DEXTROAMPHET ER 30 MG PO CP24: 30.0000 mg | ORAL_CAPSULE | Freq: Every day | ORAL | 0 refills | Status: DC

## 2023-10-01 NOTE — Telephone Encounter (Signed)
Again, your pt

## 2023-10-01 NOTE — Telephone Encounter (Signed)
 Dr John Giovanni pt

## 2023-10-01 NOTE — Progress Notes (Signed)
Pharmacy not allowing for fill of next dose of strength Adderall XR so we will try to send a note to pharmacy as well as changing next dosage strength to 30 mg of the XR instead.  There is still a delay of 15 days between when pharmacy is willing to fill the immediate release and extended release complicating care further.

## 2023-10-01 NOTE — Telephone Encounter (Signed)
 Your pt

## 2023-10-02 ENCOUNTER — Telehealth (HOSPITAL_COMMUNITY): Payer: BC Managed Care – PPO | Admitting: Psychiatry

## 2023-10-03 ENCOUNTER — Ambulatory Visit (HOSPITAL_COMMUNITY): Payer: BC Managed Care – PPO | Admitting: Clinical

## 2023-10-08 ENCOUNTER — Telehealth (INDEPENDENT_AMBULATORY_CARE_PROVIDER_SITE_OTHER): Payer: BC Managed Care – PPO | Admitting: Psychiatry

## 2023-10-08 ENCOUNTER — Encounter (HOSPITAL_COMMUNITY): Payer: Self-pay | Admitting: Psychiatry

## 2023-10-08 DIAGNOSIS — F902 Attention-deficit hyperactivity disorder, combined type: Secondary | ICD-10-CM | POA: Diagnosis not present

## 2023-10-08 DIAGNOSIS — F411 Generalized anxiety disorder: Secondary | ICD-10-CM

## 2023-10-08 DIAGNOSIS — F4310 Post-traumatic stress disorder, unspecified: Secondary | ICD-10-CM | POA: Diagnosis not present

## 2023-10-08 DIAGNOSIS — G8929 Other chronic pain: Secondary | ICD-10-CM

## 2023-10-08 DIAGNOSIS — F332 Major depressive disorder, recurrent severe without psychotic features: Secondary | ICD-10-CM

## 2023-10-08 DIAGNOSIS — F17290 Nicotine dependence, other tobacco product, uncomplicated: Secondary | ICD-10-CM

## 2023-10-08 DIAGNOSIS — F410 Panic disorder [episodic paroxysmal anxiety] without agoraphobia: Secondary | ICD-10-CM

## 2023-10-08 MED ORDER — AMPHETAMINE-DEXTROAMPHET ER 20 MG PO CP24: 40.0000 mg | ORAL_CAPSULE | Freq: Every day | ORAL | 0 refills | Status: DC

## 2023-10-08 MED ORDER — AMPHETAMINE-DEXTROAMPHETAMINE 20 MG PO TABS: 20.0000 mg | ORAL_TABLET | Freq: Every day | ORAL | 0 refills | Status: DC

## 2023-10-08 MED ORDER — DULOXETINE HCL 30 MG PO CPEP: 30.0000 mg | ORAL_CAPSULE | Freq: Every day | ORAL | 0 refills | Status: DC

## 2023-10-08 NOTE — Patient Instructions (Addendum)
 We did not make any medication changes today. The Justine Null approach is the most evidence backed couples therapy available and you may want to find a therapist that is in network. "The 7 Principles of Making Marriage Work" is a book that you will likely find helpful as well. When you get a chance, please coordinate with your PCP to get an updated ECG while you are on the Adderall.
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
## 2023-10-22 ENCOUNTER — Telehealth: Payer: Self-pay | Admitting: Pharmacy Technician

## 2023-10-22 ENCOUNTER — Other Ambulatory Visit (HOSPITAL_COMMUNITY): Payer: Self-pay

## 2023-10-22 NOTE — Telephone Encounter (Signed)
 Pharmacy Patient Advocate Encounter   Received notification from Patient Pharmacy that prior authorization for [facility] 1MG /0.5ML PEN is required/requested.   Insurance verification completed.   The patient is insured through [facility] .   Per test claim: PA required; PA started via CoverMyMeds. KEY [id number] . Waiting for clinical questions to populate.

## 2023-10-23 NOTE — Telephone Encounter (Signed)
 Pharmacy Patient Advocate Encounter   Received notification from Patient Pharmacy that prior authorization for [facility] 1MG /0.5ML PEN is required/requested.   Insurance verification completed.   The patient is insured through [facility] .   Per test claim: PA required; PA submitted to above mentioned insurance via CoverMyMeds Key/confirmation #/EOC [facility] Status is pending

## 2023-10-25 ENCOUNTER — Other Ambulatory Visit (HOSPITAL_COMMUNITY): Payer: Self-pay

## 2023-11-01 ENCOUNTER — Other Ambulatory Visit (HOSPITAL_COMMUNITY): Payer: Self-pay

## 2023-11-01 ENCOUNTER — Ambulatory Visit (INDEPENDENT_AMBULATORY_CARE_PROVIDER_SITE_OTHER): Payer: BC Managed Care – PPO | Admitting: Clinical

## 2023-11-01 DIAGNOSIS — F332 Major depressive disorder, recurrent severe without psychotic features: Secondary | ICD-10-CM | POA: Diagnosis not present

## 2023-11-01 DIAGNOSIS — F411 Generalized anxiety disorder: Secondary | ICD-10-CM

## 2023-11-01 DIAGNOSIS — F4310 Post-traumatic stress disorder, unspecified: Secondary | ICD-10-CM

## 2023-11-01 DIAGNOSIS — F410 Panic disorder [episodic paroxysmal anxiety] without agoraphobia: Secondary | ICD-10-CM

## 2023-11-01 NOTE — Progress Notes (Signed)
 IN PERSON   I connected with Jeffrey Hoover on 11/01/23 at 11:00 AM EDT in person and verified that I am speaking with the correct person using two identifiers.   Location: Patient: Office Provider: Office   I discussed the limitations of evaluation and management by telemedicine and the availability of in person appointments. The patient expressed understanding and agreed to proceed. ( IN PERSON)     THERAPIST PROGRESS NOTE   Session Time: 11:00 AM -12:00 PM   Participation Level: Active   Behavioral Response: CasualAlertAnxious   Type of Therapy: Individual Therapy   Treatment Goals addressed: Coping for Anxiety/PTSD/Depression   Interventions: CBT, Motivational Interviewing, Solution Focused and Supportive   Summary: [name] is a 38 y.o. male who presents with MDD/GAD/PTSD . The OPT therapist worked with the patient for his scheduled session. The OPT therapist utilized Motivational Interviewing to assist in creating therapeutic repore. 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 The patient notes that he has been staying up for days at a time without sleeping which also have contributed to difficulty with the patients functioning and irritability contributing to conflict with his partner. 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 The patient spoke about the financial stress for his business to work out.but due to this taking more time than anticipated he is going to in the interm be going back to work. The OPT therapist overviewed patient basic need areas including sleep cycle, eating habits, hygiene, and physical exercise. The OPT therapist overviewed coping strategies to further assist the patient in  management of his MH symptoms. The OPT therapist over-viewed with the patient upcoming appointments as listed in his MyChart    Suicidal/Homicidal: Nowithout intent/plan   Therapist Response: The OPT therapist worked with the patient for the patients scheduled session. The patient was engaged in his session and gave feedback in relation to triggers, symptoms, and behavior responses over the past few weeks. The OPT therapist worked with the patient utilizing an in session Cognitive Behavioral Therapy exercise. The patient was responsive in the session and verbalized," We have been fighting a lot over stupid things which I know is the stress from the buisness and our fiances ". The OPT therapist worked with the patient over-viewing implementation of coping strategies. The OPT therapist worked with the patient overviewing his basic need areas and placing emphasis on regulating his sleep cycle and prioritizing his health.  The OPT therapist worked with the patient overviewing appointments listed in the patients MyChart.  The OPT therapist will continue treatment work with the patient in his next scheduled session.     Plan: Return again in 2/3 weeks.   Diagnosis:      Axis I: GAD/PTSD/MDD                           Axis II: No diagnosis     Collaboration of Care: Overview of patient involvement in the med therapy program with Dr. Adrian Blackwater.   Patient/Guardian was advised Release of Information must be obtained prior to any record release in order to collaborate their care with an outside provider. Patient/Guardian was advised if they have not already done  so to contact the registration department to sign all necessary forms in order for [place] to release information regarding their care.    Consent: Patient/Guardian gives verbal consent for treatment and assignment of benefits for services provided during this visit. Patient/Guardian expressed understanding and agreed to proceed.    I discussed the  assessment and treatment plan with the patient. The patient was provided an opportunity to ask questions and all were answered. The patient agreed with the plan and demonstrated an understanding of the instructions.   The patient was advised to call back or seek an in-person evaluation if the symptoms worsen or if the condition fails to improve as anticipated.   I provided 55 minutes of face-to-face time during this encounter.   Winfred Burn, LCSW    11/01/2023

## 2023-11-02 NOTE — Telephone Encounter (Signed)
 Pharmacy Patient Advocate Encounter  Received notification from [facility] that Prior Authorization for [facility] 1MG /0.5ML AUTO-INJECTORS has been DENIED.  Full denial letter will be uploaded to the media tab. See denial reason below.   PA #/Case ID/Reference #: [id number]   Patient needs to have a weight check.

## 2023-11-05 ENCOUNTER — Telehealth (HOSPITAL_COMMUNITY): Payer: BC Managed Care – PPO | Admitting: Psychiatry

## 2023-11-09 ENCOUNTER — Ambulatory Visit (HOSPITAL_COMMUNITY): Admitting: Clinical

## 2023-11-09 DIAGNOSIS — F411 Generalized anxiety disorder: Secondary | ICD-10-CM

## 2023-11-09 DIAGNOSIS — F332 Major depressive disorder, recurrent severe without psychotic features: Secondary | ICD-10-CM

## 2023-11-09 DIAGNOSIS — F4310 Post-traumatic stress disorder, unspecified: Secondary | ICD-10-CM

## 2023-11-09 DIAGNOSIS — F410 Panic disorder [episodic paroxysmal anxiety] without agoraphobia: Secondary | ICD-10-CM | POA: Diagnosis not present

## 2023-11-09 NOTE — Progress Notes (Signed)
 Virtual Visit via Telephone Note  I connected with Jeffrey Hoover on 11/09/23 at 10:00 AM EDT by telephone and verified that I am speaking with the correct person using two identifiers.  Location: Patient: home Provider: office   I discussed the limitations, risks, security and privacy concerns of performing an evaluation and management service by telephone and the availability of in person appointments. I also discussed with the patient that there may be a patient responsible charge related to this service. The patient expressed understanding and agreed to proceed.       THERAPIST PROGRESS NOTE   Session Time: 10:00 AM -10:55 AM   Participation Level: Active   Behavioral Response: CasualAlertAnxious   Type of Therapy: Individual Therapy   Treatment Goals addressed: Coping for Anxiety/PTSD/Depression   Interventions: CBT, Motivational Interviewing, Solution Focused and Supportive   Summary: Jeffrey Hoover is a 38 y.o. male who presents with MDD/GAD/PTSD . The OPT therapist worked with the patient for his scheduled session. The OPT therapist utilized Motivational Interviewing to assist in creating therapeutic repore. 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 The patient notes that he has continued to be in conflict with his partner at the point where he has been shutting down and leaving home and going out and driving to calm down. 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 The patient spoke about the financial stress for his business to work out,but due to this taking more time than anticipated he is going to in the interm be going back to work. The patient spoke about his preparation for getting ready for  the upcoming transition in going back to work in the interm to bring in finances , while still trying to get his starter business off the ground. The patient spoke about utilizing the upcoming transition and going back to work as a restart for himself to find himself.The OPT therapist overviewed patient basic need areas including sleep cycle, eating habits, hygiene, and physical exercise. The OPT therapist overviewed coping strategies to further assist the patient in management of his MH symptoms. The OPT therapist over-viewed with the patient upcoming appointments as listed in his MyChart    Suicidal/Homicidal: Nowithout intent/plan   Therapist Response: The OPT therapist worked with the patient for the patients scheduled session. The patient was engaged in his session and gave feedback in relation to triggers, symptoms, and behavior responses over the past few weeks. The OPT therapist worked with the patient utilizing an in session Cognitive Behavioral Therapy exercise. The patient was responsive in the session and verbalized," We have continued fighting a lot over stupid things with our fiances and stupid decisions I keep making, right now there is just a lot of uncertainty and unknowns that lead you to questioning everything ". The OPT therapist worked with the patient over-viewing implementation of coping strategies. The OPT therapist worked with the patient overviewing his basic need areas and placing emphasis on regulating his sleep cycle and prioritizing his health.  The patient spoke about not being sure due to all of the conflict if he wants to fix things. The patient noted he has not been compliant with his medication for the past few days. The OPT therapist  reiterated importance of  taking his medication with consistency. The patient noted he will be leaving to work out of town this coming Tuesday to start his job with the hopes that it gives him and his partner a break and will create revenue to  alleviate financial trouble and pay down debt. The OPT therapist worked with the patient overviewing appointments listed in the patients MyChart.  The OPT therapist will continue treatment work with the patient in his next scheduled session.     Plan: Return again in 2/3 weeks.   Diagnosis:      Axis I: GAD/PTSD/MDD                           Axis II: No diagnosis     Collaboration of Care: Overview of patient involvement in the med therapy program with Dr. Adrian Blackwater.   Patient/Guardian was advised Release of Information must be obtained prior to any record release in order to collaborate their care with an outside provider. Patient/Guardian was advised if they have not already done so to contact the registration department to sign all necessary forms in order for [place] to release information regarding their care.    Consent: Patient/Guardian gives verbal consent for treatment and assignment of benefits for services provided during this visit. Patient/Guardian expressed understanding and agreed to proceed.    I discussed the assessment and treatment plan with the patient. The patient was provided an opportunity to ask questions and all were answered. The patient agreed with the plan and demonstrated an understanding of the instructions.   The patient was advised to call back or seek an in-person evaluation if the symptoms worsen or if the condition fails to improve as anticipated.   I provided 55 minutes of face-to-face time during this encounter.   Winfred Burn, LCSW    11/09/2023

## 2023-11-19 DIAGNOSIS — M9902 Segmental and somatic dysfunction of thoracic region: Secondary | ICD-10-CM | POA: Diagnosis not present

## 2023-11-19 DIAGNOSIS — M546 Pain in thoracic spine: Secondary | ICD-10-CM | POA: Diagnosis not present

## 2023-11-19 DIAGNOSIS — M9901 Segmental and somatic dysfunction of cervical region: Secondary | ICD-10-CM | POA: Diagnosis not present

## 2023-11-19 DIAGNOSIS — M542 Cervicalgia: Secondary | ICD-10-CM | POA: Diagnosis not present

## 2023-11-21 DIAGNOSIS — M9901 Segmental and somatic dysfunction of cervical region: Secondary | ICD-10-CM | POA: Diagnosis not present

## 2023-11-21 DIAGNOSIS — M542 Cervicalgia: Secondary | ICD-10-CM | POA: Diagnosis not present

## 2023-11-21 DIAGNOSIS — M9902 Segmental and somatic dysfunction of thoracic region: Secondary | ICD-10-CM | POA: Diagnosis not present

## 2023-11-21 DIAGNOSIS — M546 Pain in thoracic spine: Secondary | ICD-10-CM | POA: Diagnosis not present

## 2023-11-23 ENCOUNTER — Encounter (HOSPITAL_COMMUNITY): Payer: Self-pay

## 2023-11-23 ENCOUNTER — Telehealth (HOSPITAL_COMMUNITY): Payer: Self-pay | Admitting: *Deleted

## 2023-11-23 DIAGNOSIS — F902 Attention-deficit hyperactivity disorder, combined type: Secondary | ICD-10-CM

## 2023-11-23 NOTE — Telephone Encounter (Signed)
 Patient called stating he is needing refill for his Adderall 20mg  to be sent to [facility] in [place].

## 2023-11-24 MED ORDER — AMPHETAMINE-DEXTROAMPHETAMINE 20 MG PO TABS: 20.0000 mg | ORAL_TABLET | Freq: Every day | ORAL | 0 refills | Status: DC

## 2023-11-24 NOTE — Telephone Encounter (Signed)
 PDMP reviewed Adderall IR 20 mg script sent into the pharmacy.

## 2023-11-24 NOTE — Addendum Note (Signed)
 Addended by: Donnelly Gainer on: 11/24/2023 08:01 AM   Modules accepted: Orders

## 2023-11-26 DIAGNOSIS — M9901 Segmental and somatic dysfunction of cervical region: Secondary | ICD-10-CM | POA: Diagnosis not present

## 2023-11-26 DIAGNOSIS — M542 Cervicalgia: Secondary | ICD-10-CM | POA: Diagnosis not present

## 2023-11-26 DIAGNOSIS — M546 Pain in thoracic spine: Secondary | ICD-10-CM | POA: Diagnosis not present

## 2023-11-26 DIAGNOSIS — M9902 Segmental and somatic dysfunction of thoracic region: Secondary | ICD-10-CM | POA: Diagnosis not present

## 2023-11-29 ENCOUNTER — Ambulatory Visit: Payer: Self-pay

## 2023-11-29 ENCOUNTER — Telehealth (INDEPENDENT_AMBULATORY_CARE_PROVIDER_SITE_OTHER): Admitting: Psychiatry

## 2023-11-29 ENCOUNTER — Telehealth (HOSPITAL_COMMUNITY): Admitting: Psychiatry

## 2023-11-29 ENCOUNTER — Encounter (HOSPITAL_COMMUNITY): Payer: Self-pay | Admitting: Psychiatry

## 2023-11-29 DIAGNOSIS — F902 Attention-deficit hyperactivity disorder, combined type: Secondary | ICD-10-CM

## 2023-11-29 DIAGNOSIS — F332 Major depressive disorder, recurrent severe without psychotic features: Secondary | ICD-10-CM

## 2023-11-29 DIAGNOSIS — F4310 Post-traumatic stress disorder, unspecified: Secondary | ICD-10-CM

## 2023-11-29 DIAGNOSIS — F411 Generalized anxiety disorder: Secondary | ICD-10-CM | POA: Diagnosis not present

## 2023-11-29 DIAGNOSIS — F17290 Nicotine dependence, other tobacco product, uncomplicated: Secondary | ICD-10-CM

## 2023-11-29 DIAGNOSIS — F410 Panic disorder [episodic paroxysmal anxiety] without agoraphobia: Secondary | ICD-10-CM

## 2023-11-29 DIAGNOSIS — G8929 Other chronic pain: Secondary | ICD-10-CM

## 2023-11-29 MED ORDER — AMPHETAMINE-DEXTROAMPHETAMINE 20 MG PO TABS
20.0000 mg | ORAL_TABLET | Freq: Every day | ORAL | 0 refills | Status: DC
Start: 2023-12-24 — End: 2024-01-23

## 2023-11-29 MED ORDER — AMPHETAMINE-DEXTROAMPHET ER 20 MG PO CP24: 40.0000 mg | ORAL_CAPSULE | Freq: Every day | ORAL | 0 refills | Status: DC

## 2023-11-29 MED ORDER — DULOXETINE HCL 30 MG PO CPEP: 30.0000 mg | ORAL_CAPSULE | Freq: Every day | ORAL | 1 refills | Status: AC

## 2023-11-29 MED ORDER — AMPHETAMINE-DEXTROAMPHET ER 20 MG PO CP24: 20.0000 mg | ORAL_CAPSULE | Freq: Every day | ORAL | 0 refills | Status: DC
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
## 2023-11-29 NOTE — Patient Instructions (Signed)
 We did not make any medication changes today. Try getting in touch with the [place] or [place] ([address] clinics as they are within [facility] and should be able to continue your care with access to our records.

## 2023-11-30 ENCOUNTER — Encounter: Payer: Self-pay | Admitting: Family Medicine

## 2023-11-30 NOTE — Telephone Encounter (Signed)
 Return call for same sx  Chief Complaint: bilateral arms, hands, fingers N/T, esp fingertips.   Symptoms: "feels like arms are going to explode". Blurred vision at night while driving. Pain in shoulders. Able to lift arms and use arms  Frequency: 3 weeks  Pertinent Negatives: Patient denies chest pain no difficulty breathing no weakness on either side of body. No headaches.  Disposition: [x] ED /[] Urgent Care (no appt availability in office) / [] Appointment(In office/virtual)/ []  [facility] [facility]/ [] Home Care/ [] Refused Recommended Disposition /[] [place] [facility]/ []  Follow-up with PCP Additional Notes:   Recommended ED due to worsening sx. Please advise if PCP can see patient.

## 2023-11-30 NOTE — Telephone Encounter (Signed)
 Copied from CRM [id number]. Topic: Clinical - Red Word Triage >> Nov 30, 2023 10:49 AM Tobias CROME wrote: Red Word that prompted transfer to Nurse Triage: pt still has not feeling in arms, numbness This encounter was created in error - please disregard. Please see previous triage call 11/29/23

## 2023-12-05 NOTE — Telephone Encounter (Signed)
Ed visit

## 2023-12-06 ENCOUNTER — Ambulatory Visit: Payer: Self-pay

## 2023-12-06 ENCOUNTER — Ambulatory Visit (HOSPITAL_COMMUNITY)
Admission: RE | Admit: 2023-12-06 | Discharge: 2023-12-06 | Disposition: A | Discharge status: Home / Self Care | Source: Ambulatory Visit

## 2023-12-06 VITALS — BP 140/89 | HR 95 | Ht 72.0 in | Wt 294.0 lb

## 2023-12-06 DIAGNOSIS — M2578 Osteophyte, vertebrae: Secondary | ICD-10-CM | POA: Diagnosis not present

## 2023-12-06 DIAGNOSIS — R202 Paresthesia of skin: Secondary | ICD-10-CM

## 2023-12-06 DIAGNOSIS — R200 Anesthesia of skin: Secondary | ICD-10-CM

## 2023-12-06 DIAGNOSIS — I10 Essential (primary) hypertension: Secondary | ICD-10-CM

## 2023-12-06 NOTE — Progress Notes (Unsigned)
   Acute Office Visit  Subjective:     Patient ID: Jeffrey Hoover, male    DOB: Dec 26, 1985, 38 y.o.   MRN: [id number]  Chief Complaint  Patient presents with   Medical Management of Chronic Issues    Numbness in both hands can only feel his pinky     HPI Patient is in today for numbness in both hands for past week.  Pt does report recent visits to chiropractor and has been 3 times total.  Was mainly having left shoulder issues, which has somewhat improved.   Review of Systems  Constitutional: Negative.   HENT: Negative.    Eyes: Negative.   Respiratory: Negative.    Cardiovascular: Negative.   Gastrointestinal: Negative.   Genitourinary: Negative.   Musculoskeletal: Negative.   Skin: Negative.   Neurological: Negative.   Psychiatric/Behavioral: Negative.          Objective:    BP (!) 140/89   Pulse 95   Ht 6' (1.829 m)   Wt 294 lb 0.6 oz (133.4 kg)   SpO2 96%   BMI 39.88 kg/m  {Vitals History (Optional):23777}  Physical Exam  No results found for any visits on 12/06/23.      Assessment & Plan:   Problem List Items Addressed This Visit   None   No orders of the defined types were placed in this encounter.   No follow-ups on file.  Alison Irvine, FNP

## 2023-12-07 ENCOUNTER — Ambulatory Visit (HOSPITAL_COMMUNITY): Admitting: Clinical

## 2023-12-07 DIAGNOSIS — R200 Anesthesia of skin: Secondary | ICD-10-CM | POA: Insufficient documentation

## 2023-12-07 DIAGNOSIS — F332 Major depressive disorder, recurrent severe without psychotic features: Secondary | ICD-10-CM | POA: Diagnosis not present

## 2023-12-07 DIAGNOSIS — F410 Panic disorder [episodic paroxysmal anxiety] without agoraphobia: Secondary | ICD-10-CM | POA: Diagnosis not present

## 2023-12-07 DIAGNOSIS — F4310 Post-traumatic stress disorder, unspecified: Secondary | ICD-10-CM

## 2023-12-07 DIAGNOSIS — R202 Paresthesia of skin: Secondary | ICD-10-CM | POA: Insufficient documentation

## 2023-12-07 DIAGNOSIS — F411 Generalized anxiety disorder: Secondary | ICD-10-CM | POA: Diagnosis not present

## 2023-12-07 NOTE — Assessment & Plan Note (Signed)
 Poorly controlled at this time.  He states that he stopped his lisinopril  because he thought this might be the cause of his symptoms he has been experiencing.  Symptoms have not improved since stopping lisinopril .  I have advised him to take this consistently every day.  Recommend weight loss and low-sodium diet and daily physical activity to help lower blood pressure.  Recommend follow-up in 3 months or sooner if BP remains above 140/90.

## 2023-12-07 NOTE — Assessment & Plan Note (Signed)
 Patient reports recently having an x-ray performed at his chiropractor which showed some possible degenerative changes.  We will repeat C-spine x-ray for further evaluation.  I have advised against further chiropractic manipulation of his neck, since that is when symptoms began.  He reports taking prednisone  and colchicine for this without any improvement.  Recommend continuing with over-the-counter NSAID 2-3 times a day at meals.  Recommend gentle stretching exercises of neck and shoulders.  Follow-up according to x-ray results.
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
## 2023-12-27 ENCOUNTER — Ambulatory Visit: Payer: Self-pay

## 2024-01-03 ENCOUNTER — Ambulatory Visit (HOSPITAL_COMMUNITY): Admitting: Clinical

## 2024-01-04 ENCOUNTER — Ambulatory Visit: Payer: BC Managed Care – PPO | Admitting: Family Medicine

## 2024-01-04 ENCOUNTER — Encounter: Payer: Self-pay | Admitting: Family Medicine

## 2024-01-04 VITALS — BP 124/79 | HR 81 | Resp 16 | Ht 72.0 in | Wt 290.1 lb

## 2024-01-04 DIAGNOSIS — R52 Pain, unspecified: Secondary | ICD-10-CM | POA: Diagnosis not present

## 2024-01-04 DIAGNOSIS — R7301 Impaired fasting glucose: Secondary | ICD-10-CM

## 2024-01-04 DIAGNOSIS — F909 Attention-deficit hyperactivity disorder, unspecified type: Secondary | ICD-10-CM

## 2024-01-04 DIAGNOSIS — E559 Vitamin D deficiency, unspecified: Secondary | ICD-10-CM | POA: Diagnosis not present

## 2024-01-04 DIAGNOSIS — E66812 Obesity, class 2: Secondary | ICD-10-CM

## 2024-01-04 DIAGNOSIS — I10 Essential (primary) hypertension: Secondary | ICD-10-CM

## 2024-01-04 DIAGNOSIS — Z6839 Body mass index (BMI) 39.0-39.9, adult: Secondary | ICD-10-CM

## 2024-01-04 NOTE — Progress Notes (Signed)
 Established Patient Office Visit   Subjective  Patient ID: Jeffrey Hoover, male    DOB: Jan 31, 1986  Age: 38 y.o. MRN: [id number]  Chief Complaint  Patient presents with   Follow-up    Still having an issue with numbness in his hands    Medication Refill    States Dr Cathyann Cobia left and he needs to see if he can get his adderall from our office. Currently trying to wean off cymbalta  but having a hard time with brain zaps and vomiting    Weight Gain    States he has gained weight since not being able to get the wegovy      He  has a past medical history of Cannabis use disorder, moderate, in sustained remission (HCC) (09/08/2022), Chronic pain, Chronic suicidal ideation (09/08/2022), Depression, Does not have primary care provider (09/08/2022), Generalized anxiety disorder with panic attacks, History of alcohol use disorder, Hypertension, and Tobacco use disorder.  HPI Patient presents to the clinic for chronic follow up. For the details of today's visit, please refer to assessment and plan.   Review of Systems  Constitutional:  Negative for chills and fever.  Respiratory:  Negative for shortness of breath.   Cardiovascular:  Negative for chest pain.  Musculoskeletal:  Positive for myalgias.  Neurological:  Positive for tingling and weakness.       Numbing sensation bilateral hands      Objective:     BP 124/79   Pulse 81   Resp 16   Ht 6' (1.829 m)   Wt 290 lb 1.9 oz (131.6 kg)   SpO2 97%   BMI 39.35 kg/m  BP Readings from Last 3 Encounters:  01/04/24 124/79  12/06/23 (!) 140/89  09/06/23 (!) 162/109      Physical Exam Vitals reviewed.  Constitutional:      General: He is not in acute distress.    Appearance: Normal appearance. He is not ill-appearing, toxic-appearing or diaphoretic.  HENT:     Head: Normocephalic.  Eyes:     General:        Right eye: No discharge.        Left eye: No discharge.     Conjunctiva/sclera: Conjunctivae normal.  Cardiovascular:      Rate and Rhythm: Normal rate.     Pulses: Normal pulses.     Heart sounds: Normal heart sounds.  Pulmonary:     Effort: Pulmonary effort is normal. No respiratory distress.     Breath sounds: Normal breath sounds.  Musculoskeletal:        General: Tenderness present.  Skin:    General: Skin is warm and dry.  Neurological:     Mental Status: He is alert.     Coordination: Coordination normal.     Gait: Gait normal.  Psychiatric:        Mood and Affect: Mood normal.        Behavior: Behavior normal.      No results found for any visits on 01/04/24.  The ASCVD Risk score (Arnett DK, et al., 2019) failed to calculate for the following reasons:   The 2019 ASCVD risk score is only valid for ages 93 to 73    Assessment & Plan:  Attention deficit hyperactivity disorder (ADHD), unspecified ADHD type -     Ambulatory referral to Psychiatry  Primary hypertension Assessment & Plan: Vitals:   01/04/24 1335  BP: 124/79   Lisinopril  10 mg once daily Labs ordered. Discussed with  patient to  monitor their blood pressure regularly and maintain a heart-healthy diet rich in fruits, vegetables, whole grains, and low-fat dairy, while reducing sodium intake to less than 2,300 mg per day. Regular physical activity, such as 30 minutes of moderate exercise most days of the week, will help lower blood pressure and improve overall cardiovascular health. Avoiding smoking, limiting alcohol consumption, and managing stress. Take  prescribed medication, & take it as directed and avoid skipping doses. Seek emergency care if your blood pressure is (over 180/100) or you experience chest pain, shortness of breath, or sudden vision changes.Patient verbalizes understanding regarding plan of care and all questions answered.   Orders: -     BMP8+eGFR -     CBC with Differential/Platelet -     Lipid panel  IFG (impaired fasting glucose) -     Hemoglobin A1c  Vitamin D  deficiency -     VITAMIN D  25  Hydroxy (Vit-D Deficiency, Fractures)  Diffuse pain -     ANA w/Reflex  Class 2 severe obesity due to excess calories with serious comorbidity and body mass index (BMI) of 39.0 to 39.9 in adult [facility]) Assessment & Plan: Current weight 290 lbs  BMI 39.35 Advise patient needs to lose 14.5 lbs before wegovy  can be covered by insurance.  Discussed Eat a Balanced Diet: Focus on whole, nutrient-dense foods like lean proteins, vegetables, fruits, whole grains, and healthy fats while avoiding processed and sugary foods. Stay Active: Incorporate at least 30 minutes of moderate physical activity most days of the week, such as walking, jogging, or strength training. Hydrate and Rest: Drink plenty of water throughout the day and ensure you get 7-9 hours of quality sleep each night to support metabolism and recovery. Practice Portion Control: Use smaller plates, measure portions, and eat mindfully to avoid overeating and manage calorie intake effectively.      Return in about 4 months (around 05/06/2024), or if symptoms worsen or fail to improve, for hypertension, chronic follow-up.   Avelino Lek Amber Bail, FNP

## 2024-01-04 NOTE — Assessment & Plan Note (Signed)
 Current weight 290 lbs  BMI 39.35 Advise patient needs to lose 14.5 lbs before wegovy  can be covered by insurance.  Discussed Eat a Balanced Diet: Focus on whole, nutrient-dense foods like lean proteins, vegetables, fruits, whole grains, and healthy fats while avoiding processed and sugary foods. Stay Active: Incorporate at least 30 minutes of moderate physical activity most days of the week, such as walking, jogging, or strength training. Hydrate and Rest: Drink plenty of water throughout the day and ensure you get 7-9 hours of quality sleep each night to support metabolism and recovery. Practice Portion Control: Use smaller plates, measure portions, and eat mindfully to avoid overeating and manage calorie intake effectively.

## 2024-01-04 NOTE — Patient Instructions (Addendum)
        Great to see you today.  I have refilled the medication(s) we provide.    5% weight loss of 290 pounds, losing 5% of 290 pounds means a weight loss of 14.5 pounds. Weight needs to be 275.5 pounds for weogvy to be covered next visit.  If labs were collected, we will inform you of lab results once received either by echart message or telephone call.   - echart message- for normal results that have been seen by the patient already.   - telephone call: abnormal results or if patient has not viewed results in their echart.   - Please take medications as prescribed. - Follow up with your primary health provider if any health concerns arises. - If symptoms worsen please contact your primary care provider and/or visit the emergency department.

## 2024-01-04 NOTE — Progress Notes (Deleted)
 [id number]ddddddddddddddddddddddrfdddddddddddddddddr,

## 2024-01-04 NOTE — Assessment & Plan Note (Signed)
 Vitals:   01/04/24 1335  BP: 124/79   Lisinopril  10 mg once daily Labs ordered. Discussed with  patient to monitor their blood pressure regularly and maintain a heart-healthy diet rich in fruits, vegetables, whole grains, and low-fat dairy, while reducing sodium intake to less than 2,300 mg per day. Regular physical activity, such as 30 minutes of moderate exercise most days of the week, will help lower blood pressure and improve overall cardiovascular health. Avoiding smoking, limiting alcohol consumption, and managing stress. Take  prescribed medication, & take it as directed and avoid skipping doses. Seek emergency care if your blood pressure is (over 180/100) or you experience chest pain, shortness of breath, or sudden vision changes.Patient verbalizes understanding regarding plan of care and all questions answered.

## 2024-01-06 LAB — ANA W/REFLEX: Anti Nuclear Antibody (ANA): NEGATIVE

## 2024-01-10 ENCOUNTER — Other Ambulatory Visit: Payer: Self-pay | Admitting: Family Medicine

## 2024-01-11 ENCOUNTER — Other Ambulatory Visit: Payer: Self-pay

## 2024-01-11 ENCOUNTER — Ambulatory Visit (INDEPENDENT_AMBULATORY_CARE_PROVIDER_SITE_OTHER): Admitting: Clinical

## 2024-01-11 DIAGNOSIS — F411 Generalized anxiety disorder: Secondary | ICD-10-CM

## 2024-01-11 DIAGNOSIS — F410 Panic disorder [episodic paroxysmal anxiety] without agoraphobia: Secondary | ICD-10-CM

## 2024-01-11 DIAGNOSIS — F339 Major depressive disorder, recurrent, unspecified: Secondary | ICD-10-CM | POA: Diagnosis not present

## 2024-01-11 DIAGNOSIS — F332 Major depressive disorder, recurrent severe without psychotic features: Secondary | ICD-10-CM

## 2024-01-11 DIAGNOSIS — F4310 Post-traumatic stress disorder, unspecified: Secondary | ICD-10-CM | POA: Diagnosis not present

## 2024-01-11 DIAGNOSIS — R200 Anesthesia of skin: Secondary | ICD-10-CM

## 2024-01-11 NOTE — Progress Notes (Signed)
 Virtual Visit via Video Note   I connected with Jeffrey Hoover on 01/11/24 at 11:00 AM EDT by a video enabled telemedicine application and verified that I am speaking with the correct person using two identifiers.   Location: Patient: home Provider: office   I discussed the limitations of evaluation and management by telemedicine and the availability of in person appointments. The patient expressed understanding and agreed to proceed.     THERAPIST PROGRESS NOTE   Session Time: 11:00 AM -11:43 AM   Participation Level: Active   Behavioral Response: CasualAlertAnxious   Type of Therapy: Individual Therapy   Treatment Goals addressed: Coping for Anxiety/PTSD/Depression   Interventions: CBT, Motivational Interviewing, Solution Focused and Supportive   Summary: Jeffrey Hoover is a 38 y.o. male who presents with MDD/GAD/PTSD . The OPT therapist worked with the patient for his scheduled session. The OPT therapist utilized Motivational Interviewing to assist in creating therapeutic repore.  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 The OPT therapist overviewed patient basic need areas including sleep cycle, eating habits, hygiene, and physical exercise. The OPT therapist overviewed coping strategies to further assist the patient in management of his MH symptoms. The patient noted he recently has been applied to  local company  [employer] did a plant tour and was hired the same day. The patient is scheduled to start his new job this upcoming Monday.The OPT therapist over-viewed with the patient upcoming appointments as listed in his MyChart    Suicidal/Homicidal: Nowithout intent/plan   Therapist Response: The OPT  therapist worked with the patient for the patients scheduled session. The patient was engaged in his session and gave feedback in relation to triggers, symptoms, and behavior responses over the past few weeks. 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 The patient spoke about conflict feeling other co-workers are were not being compliant with his request. The OPT therapist worked with the patient utilizing an in session Cognitive Behavioral Therapy exercise. The OPT therapist worked with the patient over-viewing implementation of coping strategies. 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  The patient notes due to uncertainty he decided to leave the safety work positive. The patient notes he applied at [employer] and did a plant tour and was hired same day and will be starting this upcoming Monday. The patient spoke about the effects of coming off of Cymbalta . The patient spoke about being in transition post Dr. Cathyann Cobia his prescriber psychiatrist leaving the practice. The patient spoke about inconsistency in his compliance with Adderall and trying to find the right combination with coming off of the Cymbalta .The OPT therapist worked with the patient overviewing appointments listed in the patients MyChart.  The OPT therapist will continue treatment work with the patient in his next scheduled session.     Plan: Return  again in 2/3 weeks.   Diagnosis:      Axis I: GAD/PTSD/MDD                           Axis II: No diagnosis     Collaboration of Care:  Overview of patient involvement in the med therapy program with Dr. Cathyann Cobia.   Patient/Guardian was advised Release of Information must be obtained prior to any record release in order to collaborate their care with an outside provider. Patient/Guardian was advised if they have not already done so to contact the registration department to sign all necessary forms in order for us  to release information regarding their care.    Consent: Patient/Guardian gives verbal consent for treatment and assignment of benefits for services provided during this visit. Patient/Guardian expressed understanding and agreed to proceed.    I discussed the assessment and treatment plan with the patient. The patient was provided an opportunity to ask questions and all were answered. The patient agreed with the plan and demonstrated an understanding of the instructions.   The patient was advised to call back or seek an in-person evaluation if the symptoms worsen or if the condition fails to improve as anticipated.   I provided 43 minutes of face-to-face time during this encounter.   Lea Primmer, LCSW    01/11/2024

## 2024-01-23 ENCOUNTER — Other Ambulatory Visit: Payer: Self-pay | Admitting: Family Medicine

## 2024-01-23 ENCOUNTER — Ambulatory Visit: Payer: Self-pay

## 2024-01-23 DIAGNOSIS — F902 Attention-deficit hyperactivity disorder, combined type: Secondary | ICD-10-CM

## 2024-01-23 MED ORDER — AMPHETAMINE-DEXTROAMPHET ER 20 MG PO CP24: 40.0000 mg | ORAL_CAPSULE | Freq: Every day | ORAL | 0 refills | Status: DC

## 2024-01-23 MED ORDER — AMPHETAMINE-DEXTROAMPHETAMINE 20 MG PO TABS: 20.0000 mg | ORAL_TABLET | Freq: Every day | ORAL | 0 refills | Status: DC

## 2024-01-23 NOTE — Telephone Encounter (Signed)
 Please let patient know I'm refilling the same medication Dr. Eller Gut prescribed:  Adderall XR 40 mg in the morning (long-acting) take 2 capsules daily  Adderall IR 20 mg in the afternoon (short-acting)

## 2024-01-23 NOTE — Telephone Encounter (Signed)
 Copied from CRM [id number]. Topic: Clinical - Prescription Issue >> Jan 23, 2024 10:26 AM Chrystal Crape R wrote: Pt went to pick up his amphetamine -dextroamphetamine  (ADDERALL XR) 20 MG 24 hr capsule qty 30 [[id number]] but realized it was put in as a delayed release and not immediate release. Pt states his need this medication to be put in as immediate release and leave the Qty 60 as the way it is.

## 2024-02-21 ENCOUNTER — Other Ambulatory Visit: Payer: Self-pay | Admitting: Family Medicine

## 2024-02-21 ENCOUNTER — Ambulatory Visit (HOSPITAL_COMMUNITY): Admitting: Clinical

## 2024-02-21 DIAGNOSIS — F902 Attention-deficit hyperactivity disorder, combined type: Secondary | ICD-10-CM

## 2024-02-21 MED ORDER — AMPHETAMINE-DEXTROAMPHETAMINE 20 MG PO TABS: 20.0000 mg | ORAL_TABLET | Freq: Every day | ORAL | 0 refills | Status: DC

## 2024-02-21 NOTE — Telephone Encounter (Unsigned)
 Copied from CRM [id number]. Topic: Clinical - Medication Refill >> Feb 21, 2024  1:15 PM Donee H wrote: Medication:  amphetamine -dextroamphetamine  (ADDERALL XR) 20 MG 24 hr capsule   amphetamine -dextroamphetamine  (ADDERALL) 20 MG tablet  Has the patient contacted their pharmacy? Yes, and pharmacy advised patient to reach out to provider   This is the patient's preferred pharmacy:  [facility] [address] [place] - [address] [place] [address] Phone: [phone] Fax: [phone]  Is this the correct pharmacy for this prescription? Yes If no, delete pharmacy and type the correct one.   Has the prescription been filled recently? No  Is the patient out of the medication? Yes, patient out of amphetamine -dextroamphetamine  (ADDERALL) 20 MG tablet   Has the patient been seen for an appointment in the last year OR does the patient have an upcoming appointment? Yes  Can we respond through MyChart? Yes  Agent: Please be advised that Rx refills may take up to 3 business days. We ask that you follow-up with your pharmacy.

## 2024-02-27 ENCOUNTER — Other Ambulatory Visit: Payer: Self-pay | Admitting: Family Medicine

## 2024-02-27 DIAGNOSIS — F902 Attention-deficit hyperactivity disorder, combined type: Secondary | ICD-10-CM

## 2024-02-27 MED ORDER — AMPHETAMINE-DEXTROAMPHET ER 20 MG PO CP24: 20.0000 mg | ORAL_CAPSULE | Freq: Every day | ORAL | 0 refills | Status: DC

## 2024-02-27 NOTE — Telephone Encounter (Deleted)
 Copied from CRM [id number]. Topic: Clinical - Medication Refill >> Feb 27, 2024  1:24 PM Carla L wrote: Medication: amphetamine -dextroamphetamine  (ADDERALL XR) 20 MG 24 hr capsule   Has the patient contacted their pharmacy? Yes Told to contact the office.   This is the patient's preferred pharmacy:  [facility] [address] [place] - [address] [place] [address] [place] [address] Phone: [phone] Fax: [phone]  Is this the correct pharmacy for this prescription? Yes   Has the prescription been filled recently? No  Is the patient out of the medication? Yes  Has the patient been seen for an appointment in the last year OR does the patient have an upcoming appointment? Yes  Can we respond through MyChart? No  Agent: Please be advised that Rx refills may take up to 3 business days. We ask that you follow-up with your pharmacy.

## 2024-02-27 NOTE — Telephone Encounter (Signed)
 Copied from CRM [id number]. Topic: Clinical - Medication Refill >> Feb 27, 2024  1:24 PM Carla L wrote: Medication: amphetamine -dextroamphetamine  (ADDERALL XR) 20 MG 24 hr capsule   Has the patient contacted their pharmacy? Yes Told to contact the office.   This is the patient's preferred pharmacy:  [facility] [place] [address] [place] - [address] [place] [address] [place] [address] Phone: [phone] Fax: [phone]  Is this the correct pharmacy for this prescription? Yes   Has the prescription been filled recently? No  Is the patient out of the medication? Yes  Has the patient been seen for an appointment in the last year OR does the patient have an upcoming appointment? Yes  Can we respond through MyChart? No  Agent: Please be advised that Rx refills may take up to 3 business days. We ask that you follow-up with your pharmacy.

## 2024-02-28 ENCOUNTER — Other Ambulatory Visit: Payer: Self-pay | Admitting: Family Medicine

## 2024-02-28 ENCOUNTER — Telehealth: Payer: Self-pay

## 2024-02-28 DIAGNOSIS — F902 Attention-deficit hyperactivity disorder, combined type: Secondary | ICD-10-CM

## 2024-02-28 MED ORDER — AMPHETAMINE-DEXTROAMPHET ER 20 MG PO CP24: 40.0000 mg | ORAL_CAPSULE | Freq: Every day | ORAL | 0 refills | Status: DC

## 2024-02-28 NOTE — Telephone Encounter (Signed)
 Copied from CRM [id number]. Topic: Clinical - Prescription Issue >> Feb 28, 2024  9:21 AM Wess RAMAN wrote: Reason for CRM: Diane from [facility] pharmacy said the patient stated his  amphetamine -dextroamphetamine  (ADDERALL XR) 20 MG 24 hr capsule is written incorrectly and should be  2 capsule daily instead of 1.   Preferred Pharmacy: [facility] [address] [place] - [address] [place] [address] Phone: [phone] Fax: [phone] Hours: Not open 24 hours

## 2024-02-28 NOTE — Telephone Encounter (Signed)
 sent

## 2024-03-06 ENCOUNTER — Other Ambulatory Visit: Payer: Self-pay | Admitting: Family Medicine

## 2024-03-24 ENCOUNTER — Telehealth: Payer: Self-pay | Admitting: Family Medicine

## 2024-03-24 ENCOUNTER — Other Ambulatory Visit: Payer: Self-pay | Admitting: Family Medicine

## 2024-03-24 DIAGNOSIS — F902 Attention-deficit hyperactivity disorder, combined type: Secondary | ICD-10-CM

## 2024-03-24 NOTE — Telephone Encounter (Signed)
 Copied from CRM [id number]. Topic: Referral - Request for Referral >> Mar 24, 2024  4:45 PM Lavanda D wrote: Did the patient discuss referral with their provider in the last year? Yes (If No - schedule appointment) (If Yes - send message)  Appointment offered? Yes  Type of order/referral and detailed reason for visit: Patient would like a Psychiatrist to handle his medications so that [place] doesn't have to.  Preference of office, provider, location: Patient has no preference on referral.   If referral order, have you been seen by this specialty before? Yes, same issue. Severe ADHD. (If Yes, this issue or another issue? When? Where?  Can we respond through MyChart? Yes

## 2024-03-24 NOTE — Telephone Encounter (Unsigned)
 Copied from CRM [id number]. Topic: Clinical - Medication Refill >> Mar 24, 2024  4:42 PM Lavanda D wrote: Medication:  lisinopril  (ZESTRIL ) 10 MG tablet amphetamine -dextroamphetamine  (ADDERALL XR) 20 MG 24 hr capsule amphetamine -dextroamphetamine  (ADDERALL) 20 MG tablet  Has the patient contacted their pharmacy? No (Agent: If no, request that the patient contact the pharmacy for the refill. If patient does not wish to contact the pharmacy document the reason why and proceed with request.) (Agent: If yes, when and what did the pharmacy advise?)  This is the patient's preferred pharmacy:  [facility] [address] [place] - [address] [place] [address] Phone: [phone] Fax: [phone]  Is this the correct pharmacy for this prescription? Yes If no, delete pharmacy and type the correct one.   Has the prescription been filled recently? No  Is the patient out of the medication? Yes  Has the patient been seen for an appointment in the last year OR does the patient have an upcoming appointment? Yes  Can we respond through MyChart? Yes  Agent: Please be advised that Rx refills may take up to 3 business days. We ask that you follow-up with your pharmacy.

## 2024-03-25 MED ORDER — AMPHETAMINE-DEXTROAMPHET ER 20 MG PO CP24: 20.0000 mg | ORAL_CAPSULE | Freq: Every day | ORAL | 0 refills | Status: DC

## 2024-03-25 MED ORDER — LISINOPRIL 10 MG PO TABS: 10.0000 mg | ORAL_TABLET | Freq: Every day | ORAL | 3 refills | Status: AC

## 2024-03-25 MED ORDER — AMPHETAMINE-DEXTROAMPHETAMINE 20 MG PO TABS: 20.0000 mg | ORAL_TABLET | Freq: Every day | ORAL | 0 refills | Status: DC

## 2024-03-25 NOTE — Telephone Encounter (Signed)
 Pt is following up on the refill request for the following:  lisinopril  (ZESTRIL ) 10 MG tablet amphetamine -dextroamphetamine  (ADDERALL XR) 20 MG 24 hr capsule amphetamine -dextroamphetamine  (ADDERALL) 20 MG tablet  Pt states that he is out of the lisinorpril and only has some of the others, please advise.

## 2024-03-26 NOTE — Telephone Encounter (Signed)
 I sent this referral in July and patient was called once please follow up

## 2024-04-04 ENCOUNTER — Other Ambulatory Visit: Payer: Self-pay | Admitting: Family Medicine

## 2024-04-04 ENCOUNTER — Telehealth: Payer: Self-pay | Admitting: Family Medicine

## 2024-04-04 DIAGNOSIS — F902 Attention-deficit hyperactivity disorder, combined type: Secondary | ICD-10-CM

## 2024-04-04 MED ORDER — AMPHETAMINE-DEXTROAMPHET ER 20 MG PO CP24: 40.0000 mg | ORAL_CAPSULE | Freq: Every day | ORAL | 0 refills | Status: DC

## 2024-04-04 NOTE — Telephone Encounter (Signed)
 LVM to inform.

## 2024-04-04 NOTE — Telephone Encounter (Unsigned)
 Copied from CRM #[id number]. Topic: Clinical - Medication Refill >> Apr 04, 2024 10:33 AM Turkey B wrote: Medication: amphetamine -dextroamphetamine  (ADDERALL) 20 MG tablet Patient received other meds but not this one Shows at the pharmacy on 08/12 but was told pharmacy didn't receive this  Has the patient contacted their pharmacy? yes (Agent: If yes, when and what did the pharmacy advise?)contact pcp  This is the patient's preferred pharmacy:  [facility] [address] [place] - [address] [address] Phone: [phone] Fax: [phone]  Is this the correct pharmacy for this prescription? yes   Has the prescription been filled recently? no  Is the patient out of the medication? yes  Has the patient been seen for an appointment in the last year OR does the patient have an upcoming appointment? yes  Can we respond through MyChart? yes  Agent: Please be advised that Rx refills may take up to 3 business days. We ask that you follow-up with your pharmacy.

## 2024-04-04 NOTE — Telephone Encounter (Signed)
 Copied from CRM #[id number]. Topic: Referral - Request for Referral >> Apr 04, 2024 10:38 AM Turkey B wrote: Did the patient discuss referral with their provider in the last year? yes    Type of order/referral and detailed reason for visit: psychiatrist for medication management for adhd  Preference of office, provider, location: ?  If referral order, have you been seen by this specialty before? Yes but  patient says was dropped by previous one   Can we respond through MyChart? yes

## 2024-04-04 NOTE — Telephone Encounter (Signed)
  No new referral needed. He said the last referral was dropped but looks like he just needs to call them back to schedule at [phone]. If any concerns, please let us  know

## 2024-04-09 ENCOUNTER — Other Ambulatory Visit: Payer: Self-pay | Admitting: Family Medicine

## 2024-04-10 NOTE — Telephone Encounter (Signed)
  Patient needs to have a weight check and office visit for insurance to approve this

## 2024-04-25 ENCOUNTER — Ambulatory Visit: Payer: Self-pay
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
## 2024-04-25 NOTE — Telephone Encounter (Signed)
 Pt offered appt Monday at neighborhood office but refused, wants to wait it out.

## 2024-04-28 ENCOUNTER — Encounter: Payer: Self-pay | Admitting: Family Medicine

## 2024-04-29 ENCOUNTER — Other Ambulatory Visit: Payer: Self-pay | Admitting: Family Medicine

## 2024-04-29 DIAGNOSIS — F902 Attention-deficit hyperactivity disorder, combined type: Secondary | ICD-10-CM

## 2024-04-29 MED ORDER — AMPHETAMINE-DEXTROAMPHET ER 20 MG PO CP24: 20.0000 mg | ORAL_CAPSULE | Freq: Every day | ORAL | 0 refills | Status: DC

## 2024-04-29 NOTE — Telephone Encounter (Signed)
 Copied from CRM [id number]. Topic: Clinical - Medication Refill >> Apr 29, 2024 11:32 AM Carlatta H wrote: Medication: amphetamine -dextroamphetamine  (ADDERALL XR) 20 MG 24 hr capsule  Has the patient contacted their pharmacy? No (Agent: If no, request that the patient contact the pharmacy for the refill. If patient does not wish to contact the pharmacy document the reason why and proceed with request.) (Agent: If yes, when and what did the pharmacy advise?)  This is the patient's preferred pharmacy:  [facility] [address] [place] - [address] [place] [address] Phone: [phone] Fax: [phone]  Is this the correct pharmacy for this prescription? Yes If no, delete pharmacy and type the correct one.   Has the prescription been filled recently? No  Is the patient out of the medication? Yes  Has the patient been seen for an appointment in the last year OR does the patient have an upcoming appointment? Yes  Can we respond through MyChart? No  Agent: Please be advised that Rx refills may take up to 3 business days. We ask that you follow-up with your pharmacy.

## 2024-04-30 ENCOUNTER — Telehealth: Payer: Self-pay

## 2024-04-30 ENCOUNTER — Telehealth: Payer: Self-pay | Admitting: Family Medicine

## 2024-04-30 ENCOUNTER — Other Ambulatory Visit: Payer: Self-pay | Admitting: Family Medicine

## 2024-04-30 DIAGNOSIS — F902 Attention-deficit hyperactivity disorder, combined type: Secondary | ICD-10-CM

## 2024-04-30 MED ORDER — AMPHETAMINE-DEXTROAMPHETAMINE 20 MG PO TABS: 20.0000 mg | ORAL_TABLET | Freq: Every day | ORAL | 0 refills | Status: DC

## 2024-04-30 MED ORDER — AMPHETAMINE-DEXTROAMPHET ER 20 MG PO CP24
40.0000 mg | ORAL_CAPSULE | Freq: Every day | ORAL | 0 refills | Status: DC
Start: 2024-04-30 — End: 2024-05-30

## 2024-04-30 NOTE — Telephone Encounter (Signed)
 Patient advised.

## 2024-04-30 NOTE — Telephone Encounter (Signed)
 sent

## 2024-04-30 NOTE — Telephone Encounter (Signed)
 Please inform patient to call and schedule a appointment- referral has been placed :  [facility] [address] [place] [address] [phone]   Referral Start Date: 01/04/2024 Referral End Date: 01/03/2025

## 2024-04-30 NOTE — Telephone Encounter (Signed)
 Copied from CRM [id number]. Topic: Clinical - Medication Refill >> Apr 29, 2024 11:32 AM Carlatta H wrote: Medication: amphetamine -dextroamphetamine  (ADDERALL XR) 20 MG 24 hr capsule  Has the patient contacted their pharmacy? No (Agent: If no, request that the patient contact the pharmacy for the refill. If patient does not wish to contact the pharmacy document the reason why and proceed with request.) (Agent: If yes, when and what did the pharmacy advise?)  This is the patient's preferred pharmacy:  [facility] [address] [place] - [address] [place] [address] Phone: [phone] Fax: [phone]  Is this the correct pharmacy for this prescription? Yes If no, delete pharmacy and type the correct one.   Has the prescription been filled recently? No  Is the patient out of the medication? Yes  Has the patient been seen for an appointment in the last year OR does the patient have an upcoming appointment? Yes  Can we respond through MyChart? No  Agent: Please be advised that Rx refills may take up to 3 business days. We ask that you follow-up with your pharmacy. >> Apr 29, 2024  5:57 PM Zebedee SAUNDERS wrote: Pt call stated amphetamine -dextroamphetamine  (ADDERALL) 20 MG 24 hr capsule needs to be immediate release and not extended release XR. Please send to [facility] [address], [place] - [address] [place] [address] Phone: [phone] Fax: [phone]

## 2024-04-30 NOTE — Telephone Encounter (Signed)
 Copied from CRM [id number]. Topic: Clinical - Prescription Issue >> Apr 30, 2024  9:48 AM Edsel HERO wrote: Patient states that his rx for amphetamine -dextroamphetamine  (ADDERALL XR) 20 MG 24 hr capsule is incorrect. Patient states that he is supposes to be taking 2 - 20mg (40mg  total) of the XR a day. Patient would like an updated rx sent to [facility] [address] [place] - [address] [place]  Phone: [phone] Fax: [phone]

## 2024-05-08 ENCOUNTER — Telehealth (INDEPENDENT_AMBULATORY_CARE_PROVIDER_SITE_OTHER): Admitting: Family Medicine

## 2024-05-08 DIAGNOSIS — I10 Essential (primary) hypertension: Secondary | ICD-10-CM | POA: Diagnosis not present

## 2024-05-08 NOTE — Assessment & Plan Note (Signed)
 At home blood pressure reading 124/89 Continue  Lisinopril  10 mg once daily Labs ordered. Continued discussion on DASH diet, low sodium diet and maintain a exercise routine for 150 minutes per week.

## 2024-05-08 NOTE — Progress Notes (Signed)
   Virtual Visit via Video Note  I connected with [name] on 05/08/24 at  1:40 PM EDT by a video enabled telemedicine application and verified that I am speaking with the correct person using two identifiers.  Patient Location: Home Provider Location: Office/Clinic  I discussed the limitations, risks, security, and privacy concerns of performing an evaluation and management service by video and the availability of in person appointments. I also discussed with the patient that there may be a patient responsible charge related to this service. The patient expressed understanding and agreed to proceed.  Subjective: PCP: Jeffrey Wilhelmena Hoover Hilario, FNP  No chief complaint on file.  HPI Patient presents via telehealth for follow up. For the details of today's visit, please refer to assessment and plan.    ROS: Per HPI  Current Outpatient Medications:    acetaminophen (TYLENOL) 500 MG tablet, Take 500 mg by mouth every 8 (eight) hours as needed for moderate pain., Disp: , Rfl:    amphetamine -dextroamphetamine  (ADDERALL XR) 20 MG 24 hr capsule, Take 2 capsules (40 mg total) by mouth daily., Disp: 60 capsule, Rfl: 0   amphetamine -dextroamphetamine  (ADDERALL) 20 MG tablet, Take 1 tablet (20 mg total) by mouth daily., Disp: 30 tablet, Rfl: 0   clindamycin  (CLEOCIN  T) 1 % lotion, Apply topically 2 (two) times daily as needed. Apply topically 2 (two) times daily as needed., Disp: 60 mL, Rfl: 0   clobetasol  (TEMOVATE ) 0.05 % external solution, Apply 1 Application topically 2 (two) times daily., Disp: 50 mL, Rfl: 1   DULoxetine  (CYMBALTA ) 30 MG capsule, Take 1 capsule (30 mg total) by mouth daily., Disp: 90 capsule, Rfl: 1   lisinopril  (ZESTRIL ) 10 MG tablet, Take 1 tablet (10 mg total) by mouth daily., Disp: 30 tablet, Rfl: 3   mupirocin  ointment (BACTROBAN ) 2 %, Apply 1 Application topically 2 (two) times daily., Disp: 30 g, Rfl: 0  Observations/Objective: There were no vitals filed for  this visit. Physical Exam Patient is alert and no acute distress noted.   Assessment and Plan: Primary hypertension Assessment & Plan: At home blood pressure reading 124/89 Continue  Lisinopril  10 mg once daily Labs ordered. Continued discussion on DASH diet, low sodium diet and maintain a exercise routine for 150 minutes per week.      Follow Up Instructions: No follow-ups on file.   I discussed the assessment and treatment plan with the patient. The patient was provided an opportunity to ask questions, and all were answered. The patient agreed with the plan and demonstrated an understanding of the instructions.   The patient was advised to call back or seek an in-person evaluation if the symptoms worsen or if the condition fails to improve as anticipated.  The above assessment and management plan was discussed with the patient. The patient verbalized understanding of and has agreed to the management plan.   Jeffrey Jeffrey Wilhelmena Lloyd, FNP

## 2024-05-15 ENCOUNTER — Emergency Department (HOSPITAL_COMMUNITY)
Admission: EM | Admit: 2024-05-15 | Discharge: 2024-05-16 | Disposition: A | Discharge status: Home / Self Care | Attending: Emergency Medicine | Admitting: Emergency Medicine

## 2024-05-15 ENCOUNTER — Encounter (HOSPITAL_COMMUNITY): Payer: Self-pay | Admitting: Emergency Medicine

## 2024-05-15 ENCOUNTER — Other Ambulatory Visit: Payer: Self-pay

## 2024-05-15 DIAGNOSIS — I889 Nonspecific lymphadenitis, unspecified: Secondary | ICD-10-CM | POA: Diagnosis not present

## 2024-05-15 DIAGNOSIS — R1030 Lower abdominal pain, unspecified: Secondary | ICD-10-CM | POA: Diagnosis present

## 2024-05-15 LAB — CBC
HCT: 38.6 % — ABNORMAL LOW (ref 39.0–52.0)
Hemoglobin: 13.2 g/dL (ref 13.0–17.0)
MCH: 29.7 pg (ref 26.0–34.0)
MCHC: 34.2 g/dL (ref 30.0–36.0)
MCV: 86.7 fL (ref 80.0–100.0)
Platelets: 176 K/uL (ref 150–400)
RBC: 4.45 MIL/uL (ref 4.22–5.81)
RDW: 12.6 % (ref 11.5–15.5)
WBC: 6.1 K/uL (ref 4.0–10.5)
nRBC: 0 % (ref 0.0–0.2)

## 2024-05-15 LAB — COMPREHENSIVE METABOLIC PANEL WITH GFR
ALT: 27 U/L (ref 0–44)
AST: 20 U/L (ref 15–41)
Albumin: 4.2 g/dL (ref 3.5–5.0)
Alkaline Phosphatase: 124 U/L (ref 38–126)
Anion gap: 9 (ref 5–15)
BUN: 11 mg/dL (ref 6–20)
CO2: 28 mmol/L (ref 22–32)
Calcium: 9.1 mg/dL (ref 8.9–10.3)
Chloride: 99 mmol/L (ref 98–111)
Creatinine, Ser: 0.92 mg/dL (ref 0.61–1.24)
GFR, Estimated: >60 mL/min (ref >60)
Glucose, Bld: 97 mg/dL (ref 70–99)
Potassium: 4 mmol/L (ref 3.5–5.1)
Sodium: 135 mmol/L (ref 135–145)
Total Bilirubin: 0.4 mg/dL (ref 0.0–1.2)
Total Protein: 6.9 g/dL (ref 6.5–8.1)

## 2024-05-15 LAB — LIPASE, BLOOD: Lipase: 23 U/L (ref 11–51)

## 2024-05-15 MED ORDER — ACETAMINOPHEN 325 MG PO TABS
650.0000 mg | ORAL_TABLET | Freq: Once | ORAL | Status: AC
  Administered 2024-05-15: 650 mg via ORAL
  Filled 2024-05-15: qty 2

## 2024-05-15 NOTE — ED Triage Notes (Signed)
 Pt c/o pain to right groin area and radiating up into RLQ area x 2 days ago. Denies n/v/d. Denies genital swelling but states the lower abd is. 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 Nad

## 2024-05-16 ENCOUNTER — Emergency Department (HOSPITAL_COMMUNITY)

## 2024-05-16 LAB — URINALYSIS, ROUTINE W REFLEX MICROSCOPIC
Bilirubin Urine: NEGATIVE
Glucose, UA: NEGATIVE mg/dL
Hgb urine dipstick: NEGATIVE
Ketones, ur: NEGATIVE mg/dL
Leukocytes,Ua: NEGATIVE
Nitrite: NEGATIVE
Protein, ur: NEGATIVE mg/dL
Specific Gravity, Urine: 1.028 (ref 1.005–1.030)
pH: 6 (ref 5.0–8.0)

## 2024-05-16 LAB — RESP PANEL BY RT-PCR (RSV, FLU A&B, COVID)  RVPGX2
Influenza A by PCR: NEGATIVE
Influenza B by PCR: NEGATIVE
Resp Syncytial Virus by PCR: NEGATIVE
SARS Coronavirus 2 by RT PCR: NEGATIVE

## 2024-05-16 LAB — LACTIC ACID, PLASMA: Lactic Acid, Venous: 1 mmol/L (ref 0.5–1.9)

## 2024-05-16 LAB — C-REACTIVE PROTEIN: CRP: 9.1 mg/dL — ABNORMAL HIGH (ref <1.0)

## 2024-05-16 LAB — SEDIMENTATION RATE: Sed Rate: 18 mm/h — ABNORMAL HIGH (ref 0–15)

## 2024-05-16 MED ORDER — IBUPROFEN 400 MG PO TABS
400.0000 mg | ORAL_TABLET | Freq: Once | ORAL | Status: AC
  Administered 2024-05-16: 400 mg via ORAL
  Filled 2024-05-16: qty 1

## 2024-05-16 MED ORDER — ONDANSETRON HCL 4 MG/2ML IJ SOLN
4.0000 mg | Freq: Once | INTRAMUSCULAR | Status: AC
  Administered 2024-05-16: 4 mg via INTRAVENOUS
  Filled 2024-05-16: qty 2

## 2024-05-16 MED ORDER — LEVOFLOXACIN 750 MG PO TABS
750.0000 mg | ORAL_TABLET | Freq: Once | ORAL | Status: AC
  Administered 2024-05-16: 750 mg via ORAL
  Filled 2024-05-16: qty 1

## 2024-05-16 MED ORDER — IOHEXOL 300 MG/ML  SOLN
100.0000 mL | Freq: Once | INTRAMUSCULAR | Status: AC | PRN
  Administered 2024-05-16: 100 mL via INTRAVENOUS

## 2024-05-16 MED ORDER — LEVOFLOXACIN 750 MG PO TABS: 750.0000 mg | ORAL_TABLET | Freq: Every day | ORAL | 0 refills | Status: AC
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
## 2024-05-21 LAB — CULTURE, BLOOD (SINGLE)
Culture: NO GROWTH
Special Requests: ADEQUATE

## 2024-05-28 ENCOUNTER — Other Ambulatory Visit: Payer: Self-pay

## 2024-05-28 NOTE — Telephone Encounter (Unsigned)
 Copied from CRM [id number]. Topic: Clinical - Medication Refill >> May 28, 2024 12:18 PM Geneva B wrote: Medication: amphetamine -dextroamphetamine  (ADDERALL XR) 20 MG 24 hr capsule [amphetamine -dextroamphetamine  (ADDERALL) 20 MG table  Has the patient contacted their pharmacy? Yes (Agent: If no, request that the patient contact the pharmacy for the refill. If patient does not wish to contact the pharmacy document the reason why and proceed with request.) (Agent: If yes, when and what did the pharmacy advise?)  This is the patient's preferred pharmacy:  [facility] [address] [place] - [address] [place] [address] Phone: [phone] Fax: [phone]  Is this the correct pharmacy for this prescription? Yes If no, delete pharmacy and type the correct one.   Has the prescription been filled recently? Yes  Is the patient out of the medication? Yes  Has the patient been seen for an appointment in the last year OR does the patient have an upcoming appointment? Yes  Can we respond through MyChart? No  Agent: Please be advised that Rx refills may take up to 3 business days. We ask that you follow-up with your pharmacy.

## 2024-05-30 ENCOUNTER — Other Ambulatory Visit: Payer: Self-pay | Admitting: Family Medicine

## 2024-05-30 DIAGNOSIS — F902 Attention-deficit hyperactivity disorder, combined type: Secondary | ICD-10-CM

## 2024-05-30 MED ORDER — AMPHETAMINE-DEXTROAMPHET ER 20 MG PO CP24: 40.0000 mg | ORAL_CAPSULE | Freq: Every day | ORAL | 0 refills | Status: AC

## 2024-05-30 NOTE — Telephone Encounter (Signed)
 Copied from CRM [id number]. Topic: Clinical - Medication Refill >> May 28, 2024 12:18 PM Geneva B wrote: Medication: amphetamine -dextroamphetamine  (ADDERALL XR) 20 MG 24 hr capsule [amphetamine -dextroamphetamine  (ADDERALL) 20 MG table  Has the patient contacted their pharmacy? Yes (Agent: If no, request that the patient contact the pharmacy for the refill. If patient does not wish to contact the pharmacy document the reason why and proceed with request.) (Agent: If yes, when and what did the pharmacy advise?)  This is the patient's preferred pharmacy:  [facility] [address] [place] - [address] [place] [address] Phone: [phone] Fax: [phone]  Is this the correct pharmacy for this prescription? Yes If no, delete pharmacy and type the correct one.   Has the prescription been filled recently? Yes  Is the patient out of the medication? Yes  Has the patient been seen for an appointment in the last year OR does the patient have an upcoming appointment? Yes  Can we respond through MyChart? No  Agent: Please be advised that Rx refills may take up to 3 business days. We ask that you follow-up with your pharmacy.

## 2024-06-02 ENCOUNTER — Telehealth: Payer: Self-pay

## 2024-06-02 ENCOUNTER — Other Ambulatory Visit: Payer: Self-pay | Admitting: Family Medicine

## 2024-06-02 NOTE — Telephone Encounter (Signed)
 Copied from CRM #[id number]. Topic: Clinical - Prescription Issue >> May 30, 2024  4:22 PM Leonette SQUIBB wrote: Reason for CRM: pt called again about his Adderall prescriptions.  He is going to be out tomorrow.  He has been asking for the Adderall XR 20mg  2 times a day and the Adderall 20 mg reg. In the afternoon.  He needs these asap.  He said he had been checking on them since this morning and the pharmacy said they have not heard anything back from the office.

## 2024-06-02 NOTE — Telephone Encounter (Signed)
 Patient called again to follow up on his Adderall prescriptions. Has received his extended release, but is still needing his immediate release Adderall. Please call the patient back when the meds have been sent to the pharmacy.

## 2024-06-02 NOTE — Telephone Encounter (Signed)
 It appears that the patient's PCP discontinued the immediate-release Adderall. The patient is encouraged to continue the current regimen with the extended-release formulation and to schedule an appointment to discuss any further questions or possible adjustments to the treatment plan.

## 2024-06-03 NOTE — Telephone Encounter (Signed)
 Mychart message sent to patient.

## 2024-06-16 ENCOUNTER — Encounter: Payer: Self-pay | Admitting: Radiology

## 2024-11-06 ENCOUNTER — Ambulatory Visit (HOSPITAL_COMMUNITY): Payer: Self-pay | Admitting: Registered Nurse
